# Patient Record
Sex: Female | Born: 1955 | Race: White | Hispanic: No | Marital: Married | Smoking: Former smoker
Health system: Southern US, Community
[De-identification: ages and names within clinical notes are randomized; demographics above are authoritative.]

## PROBLEM LIST (undated history)

## (undated) DIAGNOSIS — H3530 Unspecified macular degeneration: Secondary | ICD-10-CM

## (undated) DIAGNOSIS — I10 Essential (primary) hypertension: Secondary | ICD-10-CM

## (undated) HISTORY — DX: Essential (primary) hypertension: I10

## (undated) HISTORY — DX: Unspecified macular degeneration: H35.30

---

## 2020-11-01 ENCOUNTER — Encounter: Payer: Self-pay | Admitting: Medical

## 2020-11-01 ENCOUNTER — Ambulatory Visit (HOSPITAL_BASED_OUTPATIENT_CLINIC_OR_DEPARTMENT_OTHER)
Admission: RE | Admit: 2020-11-01 | Discharge: 2020-11-01 | Disposition: A | Discharge status: Home / Self Care | Payer: 59 | Source: Ambulatory Visit | Attending: Medical | Admitting: Medical

## 2020-11-01 ENCOUNTER — Telehealth: Payer: Self-pay | Admitting: Medical

## 2020-11-01 ENCOUNTER — Ambulatory Visit (INDEPENDENT_AMBULATORY_CARE_PROVIDER_SITE_OTHER): Payer: 59 | Admitting: Medical

## 2020-11-01 ENCOUNTER — Other Ambulatory Visit: Payer: Self-pay

## 2020-11-01 VITALS — BP 202/90 | HR 91 | Resp 20 | Ht 64.0 in | Wt 229.4 lb

## 2020-11-01 DIAGNOSIS — I10 Essential (primary) hypertension: Secondary | ICD-10-CM

## 2020-11-01 DIAGNOSIS — R0600 Dyspnea, unspecified: Secondary | ICD-10-CM | POA: Diagnosis present

## 2020-11-01 DIAGNOSIS — Z1231 Encounter for screening mammogram for malignant neoplasm of breast: Secondary | ICD-10-CM

## 2020-11-01 DIAGNOSIS — M8580 Other specified disorders of bone density and structure, unspecified site: Secondary | ICD-10-CM | POA: Diagnosis not present

## 2020-11-01 DIAGNOSIS — D869 Sarcoidosis, unspecified: Secondary | ICD-10-CM | POA: Insufficient documentation

## 2020-11-01 DIAGNOSIS — M25561 Pain in right knee: Secondary | ICD-10-CM | POA: Diagnosis not present

## 2020-11-01 DIAGNOSIS — Z124 Encounter for screening for malignant neoplasm of cervix: Secondary | ICD-10-CM

## 2020-11-01 MED ORDER — ALENDRONATE SODIUM 70 MG PO TABS: 70.0000 mg | ORAL_TABLET | ORAL | 11 refills | Status: DC

## 2020-11-01 MED ORDER — LABETALOL HCL 300 MG PO TABS: 300.0000 mg | ORAL_TABLET | Freq: Two times a day (BID) | ORAL | 11 refills | Status: DC

## 2020-11-01 NOTE — Telephone Encounter (Signed)
Referral to pulmonologist placed. 

## 2020-11-01 NOTE — Progress Notes (Signed)
Subjective:    Patient ID: Bianca Stephenson, female    DOB: [birth year], 64 y.o.   MRN: [id number]  HPI  Pt in for first time.  Pt moved from [place]. Pt tries to exercise but knee pain holding her back. Moderate healthy diet. No alcohol use. Non smoker. But hx. Of smoking.  Rt knee pain for about 3 weeks. Anterior aspect pain. Stats was walking and may have twisted wrong way. Now medial aspect pain. Using ibuprofen for the pain. No prior history of knee pain.  Pt has hx of htn. She used to be on labetolol 300 mg twice a day. Pt was on it for about 5 years. Pt states her bp was about 136/90 when on. Pt had bp 179/102 at home. Pt has also some white coat component.   Over past 3 months many readings 150 systolic.  Also history of thin bones. She not sure if osteopenia or osteoporosis. Pt states  No copy of her recent bone density.  Pt stats up to date on colonoscopy. Had polyp about 4 years ago. Told repeat in 5 years.  Pt due for mammogram  Review of Systems  Constitutional: Negative for chills, fatigue and fever.  Respiratory: Negative for cough, chest tightness, shortness of breath and wheezing.   Cardiovascular: Negative for chest pain and palpitations.  Gastrointestinal: Negative for abdominal pain.  Musculoskeletal: Negative for back pain and joint swelling.  Skin: Negative for rash.  Neurological: Negative for dizziness, speech difficulty, weakness, numbness and headaches.       Only find light headed sensation.  Pt states nervous/more than usual since new provider visit per pt.  Hematological: Negative for adenopathy. Does not bruise/bleed easily.  Psychiatric/Behavioral: Negative for behavioral problems and confusion. The patient is not nervous/anxious.     No past medical history on file.   Social History   Socioeconomic History  . Marital status: Married    Spouse name: Not on file  . Number of children: Not on file  . Years of education: Not on file  . Highest  education level: Not on file  Occupational History  . Not on file  Tobacco Use  . Smoking status: Not on file  . Smokeless tobacco: Not on file  Substance and Sexual Activity  . Alcohol use: Not on file  . Drug use: Not on file  . Sexual activity: Not on file  Other Topics Concern  . Not on file  Social History Narrative  . Not on file   Social Determinants of Health   Financial Resource Strain: Not on file  Food Insecurity: Not on file  Transportation Needs: Not on file  Physical Activity: Not on file  Stress: Not on file  Social Connections: Not on file  Intimate Partner Violence: Not on file     No family history on file.  Not on File  No current outpatient medications on file prior to visit.   No current facility-administered medications on file prior to visit.    BP (!) 202/90   Pulse 91   Resp 20   Ht 5\' 4"  (1.626 m)   Wt 229 lb 6.4 oz (104.1 kg)   SpO2 95%   BMI 39.38 kg/m       Objective:   Physical Exam  General Mental Status- Alert. General Appearance- Not in acute distress.   Skin General: Color- Normal Color. Moisture- Normal Moisture.  Neck Carotid Arteries- Normal color. Moisture- Normal Moisture. No carotid bruits. No JVD.  Chest  and Lung Exam Auscultation: Breath Sounds:-Normal.  Cardiovascular Auscultation:Rythm- Regular. Murmurs & Other Heart Sounds:Auscultation of the heart reveals- No Murmurs.  Abdomen Inspection:-Inspeection Normal. Palpation/Percussion:Note:No mass. Palpation and Percussion of the abdomen reveal- Non Tender, Non Distended + BS, no rebound or guarding.   Neurologic Cranial Nerve exam:- CN III-XII intact(No nystagmus), symmetric smile. Normal/IntactStrength:- 5/5 equal and symmetric strength both upper and lower extremities.  Rt knee- no swelling, no warmth or tenderness on palpation. Medial tibial plateau area tenderness when fully extends.      Assessment & Plan:  Your blood pressure is very high  today but you have been off of labetalol for 3 months.  Normal neurologic exam with no obvious gross motor or sensory function deficits.  You report mild lightheaded sensation but nervous about being here today.  With such a high blood pressure if you have any gross motor or sensory function deficits then have to advise ED evaluation.  Expect now that you restart a medication that your blood pressure will drop to your former controlled range.  Check blood pressure daily and verify coming down.  History of osteopenia or osteoporosis.  Would asked that you sign release of medical information form so we can review your prior DEXA scan.  Refilled her Fosamax prescription today.  History of recent knee pain.  Can use Tylenol presently for pain but avoid any NSAIDs as that can increase her blood pressure.  X-ray right knee today and went ahead and placed referral to sports medicine.  History of sarcoidosis and prescribed some mild dyspnea on exertion for years.  We will go ahead and get chest x-ray.  Screening mammogram order placed.  Can discuss with radiology department today.  Placed referral for screening Pap smear/referral to gynecologist.  Follow-up in 1 week for blood pressure check or sooner as needed.

## 2020-11-01 NOTE — Patient Instructions (Addendum)
Your blood pressure is very high today but you have been off of labetalol for 3 months.  Normal neurologic exam with no obvious gross motor or sensory function deficits.  You report mild lightheaded sensation but nervous about being here today.  With such a high blood pressure if you have any gross motor or sensory function deficits then have to advise ED evaluation.  Expect now that you restart a medication that your blood pressure will drop to your former controlled range.  Check blood pressure daily and verify coming down.  Please get CMP lab today.  History of osteopenia or osteoporosis.  Would asked that you sign release of medical information form so we can review your prior DEXA scan.  Refilled her Fosamax prescription today.  History of recent knee pain.  Can use Tylenol presently for pain but avoid any NSAIDs as that can increase her blood pressure.  X-ray right knee today and went ahead and placed referral to sports medicine.  History of sarcoidosis and prescribed some mild dyspnea on exertion for years.  We will go ahead and get chest x-ray.  Screening mammogram order placed.  Can discuss with radiology department today.  Placed referral for screening Pap smear/referral to gynecologist.  Follow-up in 1 week for blood pressure check or sooner as needed.

## 2020-11-02 LAB — COMPREHENSIVE METABOLIC PANEL
AG Ratio: 1.3 (calc) (ref 1.0–2.5)
ALT: 11 U/L (ref 6–29)
AST: 17 U/L (ref 10–35)
Albumin: 4.4 g/dL (ref 3.6–5.1)
Alkaline phosphatase (APISO): 58 U/L (ref 37–153)
BUN: 13 mg/dL (ref 7–25)
CO2: 25 mmol/L (ref 20–32)
Calcium: 9.8 mg/dL (ref 8.6–10.4)
Chloride: 102 mmol/L (ref 98–110)
Creat: 0.99 mg/dL (ref 0.50–0.99)
Globulin: 3.3 g/dL (calc) (ref 1.9–3.7)
Glucose, Bld: 78 mg/dL (ref 65–99)
Potassium: 4.2 mmol/L (ref 3.5–5.3)
Sodium: 139 mmol/L (ref 135–146)
Total Bilirubin: 0.4 mg/dL (ref 0.2–1.2)
Total Protein: 7.7 g/dL (ref 6.1–8.1)

## 2020-11-08 ENCOUNTER — Ambulatory Visit (INDEPENDENT_AMBULATORY_CARE_PROVIDER_SITE_OTHER): Payer: 59 | Admitting: Medical

## 2020-11-08 ENCOUNTER — Other Ambulatory Visit: Payer: Self-pay

## 2020-11-08 DIAGNOSIS — M25569 Pain in unspecified knee: Secondary | ICD-10-CM

## 2020-11-08 MED ORDER — MELOXICAM 7.5 MG PO TABS: 7.5000 mg | ORAL_TABLET | Freq: Every day | ORAL | 0 refills | Status: DC

## 2020-11-08 NOTE — Progress Notes (Addendum)
Pt here for Blood pressure check per Esperanza Richters PA-C  Pt currently takes:labetalol 300 mg   Pt reports compliance with medication.  BP today @ =128/78 HR =63  Pt advised per Esperanza Richters PA-C pt will continue regimen and send in Meloxicam 7.5mg  for pain knee.  Esperanza Richters, PA-C

## 2020-11-12 ENCOUNTER — Ambulatory Visit: Payer: Self-pay

## 2020-11-12 ENCOUNTER — Other Ambulatory Visit: Payer: Self-pay

## 2020-11-12 ENCOUNTER — Ambulatory Visit (HOSPITAL_BASED_OUTPATIENT_CLINIC_OR_DEPARTMENT_OTHER)
Admission: RE | Admit: 2020-11-12 | Discharge: 2020-11-12 | Disposition: A | Discharge status: Home / Self Care | Payer: 59 | Source: Ambulatory Visit | Attending: Medical | Admitting: Medical

## 2020-11-12 ENCOUNTER — Ambulatory Visit (INDEPENDENT_AMBULATORY_CARE_PROVIDER_SITE_OTHER): Payer: 59 | Admitting: Family Medicine

## 2020-11-12 ENCOUNTER — Encounter: Payer: Self-pay | Admitting: Family Medicine

## 2020-11-12 ENCOUNTER — Encounter (HOSPITAL_BASED_OUTPATIENT_CLINIC_OR_DEPARTMENT_OTHER): Payer: Self-pay

## 2020-11-12 VITALS — BP 192/108 | Ht 64.0 in | Wt 229.0 lb

## 2020-11-12 DIAGNOSIS — M25561 Pain in right knee: Secondary | ICD-10-CM

## 2020-11-12 DIAGNOSIS — M23203 Derangement of unspecified medial meniscus due to old tear or injury, right knee: Secondary | ICD-10-CM | POA: Diagnosis not present

## 2020-11-12 DIAGNOSIS — Z1231 Encounter for screening mammogram for malignant neoplasm of breast: Secondary | ICD-10-CM | POA: Diagnosis present

## 2020-11-12 MED ORDER — DICLOFENAC SODIUM 1 % EX GEL: 4.0000 g | Freq: Four times a day (QID) | CUTANEOUS | 11 refills | Status: DC

## 2020-11-12 NOTE — Progress Notes (Signed)
  Ellice Boultinghouse - 65 y.o. female MRN [id number]  Date of birth: [birth year]  SUBJECTIVE:  Including CC & ROS.  No chief complaint on file.   Joshlyn Beadle is a 65 y.o. female that is presenting with right knee pain.  Pain has been ongoing for about 1 month.  She has pain with walking but also has pain at night.  No injury or inciting event.  Has been taking meloxicam..  Independent review of the right knee x-ray from 4/29 shows no acute changes.   Review of Systems See HPI   HISTORY: Past Medical, Surgical, Social, and Family History Reviewed & Updated per EMR.   Pertinent Historical Findings include:  Past Medical History:  Diagnosis Date  . Hypertension     History reviewed. No pertinent surgical history.  History reviewed. No pertinent family history.  Social History   Socioeconomic History  . Marital status: Married    Spouse name: Not on file  . Number of children: Not on file  . Years of education: Not on file  . Highest education level: Not on file  Occupational History  . Occupation: retired  Tobacco Use  . Smoking status: Former Smoker    Packs/day: 1.00    Years: 10.00    Pack years: 10.00    Types: Cigarettes    Quit date: 11/01/1996    Years since quitting: 24.0  . Smokeless tobacco: Never Used  Substance and Sexual Activity  . Alcohol use: Never  . Drug use: Never  . Sexual activity: Yes  Other Topics Concern  . Not on file  Social History Narrative  . Not on file   Social Determinants of Health   Financial Resource Strain: Not on file  Food Insecurity: Not on file  Transportation Needs: Not on file  Physical Activity: Not on file  Stress: Not on file  Social Connections: Not on file  Intimate Partner Violence: Not on file     PHYSICAL EXAM:  VS: BP (!) 192/108 (BP Location: Left Arm, Patient Position: Sitting, Cuff Size: Large)   Ht 5\' 4"  (1.626 m)   Wt 229 lb (103.9 kg)   BMI 39.31 kg/m  Physical Exam Gen: NAD, alert, cooperative with  exam, well-appearing MSK:  Right knee: Normal range of motion. Normal strength resistance. Neurovascular intact  Limited ultrasound: Right knee:  Moderate effusion. Normal-appearing quadricep and patellar tendon. Moderate medial joint space with outpouching of the medial meniscus Lateral meniscus with moderate degenerative changes.  Summary: Degenerative changes of the medial meniscus  Ultrasound and interpretation by , MD    ASSESSMENT & PLAN:   Degenerative tear of medial meniscus, right Moderate effusion and degenerative changes of the medial meniscus.  Has weakness with hip abduction that is contributing to these symptoms.  Tends to pronate as well. -Counseled on home exercise therapy and supportive care. -Voltaren. -Referral to physical therapy. -Could consider injection

## 2020-11-12 NOTE — Patient Instructions (Addendum)
Nice to meet you Please try physical therapy.  Please try the exercises  Please try the rub on medicine once the pain isn't as bad  Please send me a message in MyChart with any questions or updates.  Please see me back in 4 weeks.   --Dr. Jordan Likes

## 2020-11-12 NOTE — Assessment & Plan Note (Signed)
Moderate effusion and degenerative changes of the medial meniscus.  Has weakness with hip abduction that is contributing to these symptoms.  Tends to pronate as well. -Counseled on home exercise therapy and supportive care. -Voltaren. -Referral to physical therapy. -Could consider injection

## 2020-11-27 ENCOUNTER — Encounter: Payer: Self-pay | Admitting: Physical Therapy

## 2020-11-27 ENCOUNTER — Other Ambulatory Visit: Payer: Self-pay

## 2020-11-27 ENCOUNTER — Ambulatory Visit: Payer: 59 | Attending: Family Medicine | Admitting: Physical Therapy

## 2020-11-27 DIAGNOSIS — M25561 Pain in right knee: Secondary | ICD-10-CM | POA: Insufficient documentation

## 2020-11-27 DIAGNOSIS — R262 Difficulty in walking, not elsewhere classified: Secondary | ICD-10-CM | POA: Diagnosis present

## 2020-11-27 NOTE — Therapy (Signed)
[facility] [address]  [address] [place], [place], [address] Phone: [phone]   Fax:  [phone]  Physical Therapy Evaluation  Patient Details  Name: Bianca Stephenson MRN: [id number] Date of Birth: 01-Jul-1956 Referring Provider (PT): Clare Gandy, MD   Encounter Date: 11/27/2020   PT End of Session - 11/27/20 1144    Visit Number 1    Number of Visits 1    Date for PT Re-Evaluation 11/27/20    Authorization Type [employer]    PT Start Time 1057    PT Stop Time 1139    PT Time Calculation (min) 42 min    Activity Tolerance Patient tolerated treatment well    Behavior During Therapy [facility] for tasks assessed/performed           Past Medical History:  Diagnosis Date  . Hypertension     History reviewed. No pertinent surgical history.  There were no vitals filed for this visit.    Subjective Assessment - 11/27/20 1059    Subjective Patient reports R knee pain since 10/13/20 when she was walking and felt like "something pulled" in her R knee. Pain is located over the R medial aspect. Worse with prolonged walking, bending, certain positions in bed. Notes that initially she could not even walk around the grocery store but it has improved. Denies use of AD. Better with ice and moving. Denies N/T. Would like to get set up on a HEP today.    Pertinent History HTN    Limitations Lifting;Standing;Walking;House hold activities;Sitting    How long can you sit comfortably? unlimited    How long can you stand comfortably? 20 min    How long can you walk comfortably? 20 min    Diagnostic tests 10/22/20 R knee xray: No osseous fracture or dislocation.  2. Probable joint effusion.    Patient Stated Goals get set up with some exercises    Currently in Pain? No/denies    Pain Score 0-No pain    Pain Location Knee    Pain Orientation Right;Medial    Pain Descriptors / Indicators Sharp;Dull    Pain Type Acute pain               [facility] PT Assessment - 11/27/20 1104      Assessment   Medical Diagnosis Degenerative tear of R medial meniscus    Referring Provider (PT) Clare Gandy, MD    Onset Date/Surgical Date 10/13/20    Next MD Visit 12/19/20    Prior Therapy no      Precautions   Precautions None      Balance Screen   Has the patient fallen in the past 6 months Yes    How many times? 1   fell flat on face on uneven side walk   Has the patient had a decrease in activity level because of a fear of falling?  Yes    Is the patient reluctant to leave their home because of a fear of falling?  No      Home Tourist information centre manager residence    Living Arrangements Spouse/significant other    Available Help at Discharge Family    Type of Home House    Home Access Stairs to enter    Entrance Stairs-Number of Steps 1    Entrance Stairs-Rails None    Home Layout One level    Home Equipment None      Prior Function  Level of Independence Independent    Vocation Retired    Leisure Nurse, children's Status Within Functional Limits for tasks assessed      Sensation   Light Touch Appears Intact      Posture/Postural Control   Posture/Postural Control Postural limitations    Postural Limitations Rounded Shoulders      ROM / Strength   AROM / PROM / Strength AROM;PROM;Strength      AROM   AROM Assessment Site Knee    Right/Left Knee Right;Left    Right Knee Extension 0    Right Knee Flexion 121    Left Knee Extension -3    Left Knee Flexion 124      PROM   PROM Assessment Site Knee    Right/Left Knee Right;Left    Right Knee Extension 0    Right Knee Flexion 124    Left Knee Extension -3    Left Knee Flexion 127      Strength   Strength Assessment Site Hip;Knee;Ankle    Right/Left Hip Right;Left    Right Hip Flexion 4+/5    Right Hip ABduction 4+/5    Right Hip ADduction 4+/5    Left Hip Flexion 4+/5    Left Hip ABduction 4+/5    Left  Hip ADduction 4+/5    Right/Left Knee Right;Left    Right Knee Flexion 4+/5    Right Knee Extension 4+/5    Left Knee Flexion 4+/5    Left Knee Extension 5/5    Right/Left Ankle Right;Left    Right Ankle Dorsiflexion 4+/5    Right Ankle Plantar Flexion 4+/5    Left Ankle Dorsiflexion 4+/5    Left Ankle Plantar Flexion 4+/5      Flexibility   Soft Tissue Assessment /Muscle Length yes    Quadriceps B mildly tight in mod thomas    ITB B negative Ober's      Palpation   Patella mobility mild crepitus with inferior glide of L patella; WFL and pain-free on R    Palpation comment no TTP in R knee or calf; soft tissue restriction in R distal HS and proximal gastroc; mild edema over the R medial aspect of knee      Ambulation/Gait   Assistive device None    Gait Pattern Step-through pattern;Lateral trunk lean to right;Decreased stance time - right;Decreased step length - left;Antalgic    Ambulation Surface Level;Indoor    Gait velocity slightly decreased                      Objective measurements completed on examination: See above findings.               PT Education - 11/27/20 1144    Education Details prognosis, HEP    Person(s) Educated Patient    Methods Explanation;Demonstration;Tactile cues;Verbal cues;Handout    Comprehension Verbalized understanding;Returned demonstration            PT Short Term Goals - 11/27/20 1148      PT SHORT TERM GOAL #1   Title Patient to report understanding of HEP.    Status Achieved    Target Date 11/27/20             PT Long Term Goals - 11/27/20 1149      PT LONG TERM GOAL #1   Title Patient to report understanding of HEP.    Status Achieved    Target  Date 11/27/20                  Plan - 11/27/20 1144    Clinical Impression Statement Patient is a 65 y/o F presenting to OPPT with c/o R knee pain since injuring it while walking 6 weeks ago.  Pain is located over the R medial aspect of the knee  and worse with prolonged walking, knee flexion, and certain positions in bed. Denies N/T. Pain has improved since initial onset. Patient today presenting with asymmetrical but WFL R knee ROM, good LE strength, B tight hip flexors/quads, soft tissue restriction in R distal HS and proximal gastroc, mild edema over the R medial aspect of knee, and gait deviations. Patient was educated on gentle stretching and strengthening HEP- patient reported understanding. Patient requesting 1 time visit today d/t finances.    Personal Factors and Comorbidities Age;Comorbidity 1;Fitness;Past/Current Experience;Time since onset of injury/illness/exacerbation    Comorbidities HTN    Examination-Activity Limitations Sleep;Bed Mobility;Bend;Squat;Stairs;Stand;Carry;Transfers;Lift;Locomotion Level    Examination-Participation Restrictions Church;Cleaning;Community Activity;Laundry;Meal Prep;Yard Work;Shop    Stability/Clinical Decision Making Stable/Uncomplicated    Clinical Decision Making Low    Rehab Potential Good    PT Frequency One time visit    PT Treatment/Interventions ADLs/Self Care Home Management;Cryotherapy;Electrical Stimulation;Iontophoresis 4mg /ml Dexamethasone;Moist Heat;Balance training;Therapeutic exercise;Therapeutic activities;Functional mobility training;Stair training;Gait training;Ultrasound;Neuromuscular re-education;Patient/family education;Manual techniques;Vasopneumatic Device;Taping;Energy conservation;Dry needling;Passive range of motion    PT Next Visit Plan 1 time visit    Consulted and Agree with Plan of Care Patient           Patient will benefit from skilled therapeutic intervention in order to improve the following deficits and impairments:  Abnormal gait,Decreased endurance,Increased edema,Pain,Increased fascial restricitons,Decreased activity tolerance,Decreased balance,Difficulty walking,Improper body mechanics,Postural dysfunction,Impaired flexibility  Visit Diagnosis: Acute  pain of right knee  Difficulty in walking, not elsewhere classified     Problem List Patient Active Problem List   Diagnosis Date Noted  . Degenerative tear of medial meniscus, right 11/12/2020     01/12/2021, PT, DPT 11/27/20 11:50 AM   [facility] [address]  [address] [place], [place], [place] Phone: [phone]   Fax:  [phone]  Name: Bianca Stephenson MRN: Baker Pierini Date of Birth: 12/30/1955

## 2020-12-19 ENCOUNTER — Other Ambulatory Visit: Payer: Self-pay

## 2020-12-19 ENCOUNTER — Ambulatory Visit (INDEPENDENT_AMBULATORY_CARE_PROVIDER_SITE_OTHER): Payer: 59 | Admitting: Family Medicine

## 2020-12-19 ENCOUNTER — Encounter: Payer: Self-pay | Admitting: Family Medicine

## 2020-12-19 DIAGNOSIS — M23203 Derangement of unspecified medial meniscus due to old tear or injury, right knee: Secondary | ICD-10-CM | POA: Diagnosis not present

## 2020-12-19 NOTE — Assessment & Plan Note (Signed)
Has done well with the home exercises and does not endorse any significant pain.  He has completed some physical therapy. -Counseled on home exercise therapy and supportive care. -Could consider injection.

## 2020-12-19 NOTE — Progress Notes (Signed)
  Bianca Stephenson - 65 y.o. female MRN [id number]  Date of birth: 1955-11-10  SUBJECTIVE:  Including CC & ROS.  No chief complaint on file.   Bianca Stephenson is a 65 y.o. female that is following up for her knee pain.  Her knee pain has been doing quite well.  She only notices mild pain and sometimes at night and does not notice it in the morning.  She is able to walk and do her normal activities..     Review of Systems See HPI   HISTORY: Past Medical, Surgical, Social, and Family History Reviewed & Updated per EMR.   Pertinent Historical Findings include:  Past Medical History:  Diagnosis Date   Hypertension     History reviewed. No pertinent surgical history.  History reviewed. No pertinent family history.  Social History   Socioeconomic History   Marital status: Married    Spouse name: Not on file   Number of children: Not on file   Years of education: Not on file   Highest education level: Not on file  Occupational History   Occupation: retired  Tobacco Use   Smoking status: Former    Packs/day: 1.00    Years: 10.00    Pack years: 10.00    Types: Cigarettes    Quit date: 11/01/1996    Years since quitting: 24.1   Smokeless tobacco: Never  Substance and Sexual Activity   Alcohol use: Never   Drug use: Never   Sexual activity: Yes  Other Topics Concern   Not on file  Social History Narrative   Not on file   Social Determinants of Health   Financial Resource Strain: Not on file  Food Insecurity: Not on file  Transportation Needs: Not on file  Physical Activity: Not on file  Stress: Not on file  Social Connections: Not on file  Intimate Partner Violence: Not on file     PHYSICAL EXAM:  VS: BP (!) 200/108 (BP Location: Left Arm, Patient Position: Sitting, Cuff Size: Large)   Ht 5\' 4"  (1.626 m)   Wt 229 lb (103.9 kg)   BMI 39.31 kg/m  Physical Exam Gen: NAD, alert, cooperative with exam, well-appearing     ASSESSMENT & PLAN:   Degenerative tear of  medial meniscus, right Has done well with the home exercises and does not endorse any significant pain.  He has completed some physical therapy. -Counseled on home exercise therapy and supportive care. -Could consider injection.

## 2021-01-01 ENCOUNTER — Encounter: Payer: 59 | Admitting: Obstetrics & Gynecology

## 2021-01-10 ENCOUNTER — Ambulatory Visit (INDEPENDENT_AMBULATORY_CARE_PROVIDER_SITE_OTHER): Payer: 59 | Admitting: Medical

## 2021-01-10 ENCOUNTER — Other Ambulatory Visit: Payer: Self-pay

## 2021-01-10 VITALS — BP 170/89 | HR 65 | Temp 98.2°F | Resp 18 | Ht 64.0 in | Wt 230.2 lb

## 2021-01-10 DIAGNOSIS — I10 Essential (primary) hypertension: Secondary | ICD-10-CM | POA: Diagnosis not present

## 2021-01-10 MED ORDER — CHLORTHALIDONE 25 MG PO TABS: 25.0000 mg | ORAL_TABLET | Freq: Every day | ORAL | 3 refills | Status: DC

## 2021-01-10 NOTE — Progress Notes (Signed)
Subjective:    Patient ID: Bianca Stephenson, female    DOB: 06/18/1956, 65 y.o.   MRN: [id number]  HPI  Pt in for follow up for htn.  At home bp 137/80 at 1 pm. Pt states when she had tooth pain when crown brok off and when she was on vacation/gaining wt her bp was elevated at around 160/90.  Over last week when she was eating better her 140-130/80-90.  Hx of white coat htn as well.   Patient notes that prior to labetalol she had been on various blood pressure medications that did not work.  I reviewed records that we have from former primary care and do not see a list of BP meds she was on.  Patient mentions that her cardiologist had tried various meds.  Unfortunately she cannot remember the name of that cardiologist office.   Pt former knee pain is resolved.    Review of Systems  Constitutional:  Negative for chills, fatigue and fever.  Respiratory:  Negative for cough, chest tightness, shortness of breath and wheezing.   Cardiovascular:  Negative for chest pain and palpitations.  Gastrointestinal:  Negative for abdominal pain.  Neurological:  Negative for dizziness, syncope, weakness, light-headedness, numbness and headaches.  Hematological:  Negative for adenopathy. Does not bruise/bleed easily.  Psychiatric/Behavioral:  Negative for behavioral problems, decreased concentration, dysphoric mood, sleep disturbance and suicidal ideas.     Past Medical History:  Diagnosis Date   Hypertension      Social History   Socioeconomic History   Marital status: Married    Spouse name: Not on file   Number of children: Not on file   Years of education: Not on file   Highest education level: Not on file  Occupational History   Occupation: retired  Tobacco Use   Smoking status: Former    Packs/day: 1.00    Years: 10.00    Pack years: 10.00    Types: Cigarettes    Quit date: 11/01/1996    Years since quitting: 24.2   Smokeless tobacco: Never  Substance and Sexual Activity    Alcohol use: Never   Drug use: Never   Sexual activity: Yes  Other Topics Concern   Not on file  Social History Narrative   Not on file   Social Determinants of Health   Financial Resource Strain: Not on file  Food Insecurity: Not on file  Transportation Needs: Not on file  Physical Activity: Not on file  Stress: Not on file  Social Connections: Not on file  Intimate Partner Violence: Not on file    No past surgical history on file.  No family history on file.  Allergies  Allergen Reactions   Penicillins Other (See Comments)    Current Outpatient Medications on File Prior to Visit  Medication Sig Dispense Refill   alendronate (FOSAMAX) 70 MG tablet Take 1 tablet (70 mg total) by mouth every 7 (seven) days. Take with a full glass of water on an empty stomach. 4 tablet 11   diclofenac Sodium (VOLTAREN) 1 % GEL Apply 4 g topically 4 (four) times daily. To affected joint. 100 g 11   labetalol (NORMODYNE) 300 MG tablet Take 1 tablet (300 mg total) by mouth 2 (two) times daily. 60 tablet 11   No current facility-administered medications on file prior to visit.    BP (!) 170/89   Pulse 65   Temp 98.2 F (36.8 C)   Resp 18   Ht 5\' 4"  (1.626 m)  Wt 230 lb 3.2 oz (104.4 kg)   SpO2 97%   BMI 39.51 kg/m       Objective:   Physical Exam   General Mental Status- Alert. General Appearance- Not in acute distress.   Skin General: Color- Normal Color. Moisture- Normal Moisture.  Neck Carotid Arteries- Normal color. Moisture- Normal Moisture. No carotid bruits. No JVD.  Chest and Lung Exam Auscultation: Breath Sounds:-Normal.  Cardiovascular Auscultation:Rythm- Regular. Murmurs & Other Heart Sounds:Auscultation of the heart reveals- No Murmurs.  Abdomen Inspection:-Inspeection Normal. Palpation/Percussion:Note:No mass. Palpation and Percussion of the abdomen reveal- Non Tender, Non Distended + BS, no rebound or guarding.    Neurologic Cranial Nerve exam:- CN  III-XII intact(No nystagmus), symmetric smile. Strength:- 5/5 equal and symmetric strength both upper and lower extremities.      Assessment & Plan:   History of hypertension with recent  high levels when crown cracked and while she was on vacation.  Now at tooth pain has resolved and she is eating better her blood pressure has decreased over the last week.  At home blood pressure reading today was 137/80.  However high reading when we checked.  She does have history of whitecoat hypertension as well.  Recommend that you continue labetalol, low-salt diet and continue to try to lose some weight.  Check blood pressure later today when you are relaxed at home.  If BP less than 140/90 continue labetalol.  If greater than 140/90 then go ahead and add chlorthalidone 25 mg daily.  Get CMP today.  Glad to hear for knee pain resolved.  Follow-up in 1 month or as needed.

## 2021-01-10 NOTE — Patient Instructions (Addendum)
History of hypertension with recent  high levels when crown cracked and while she was on vacation.  Now at tooth pain has resolved and she is eating better her blood pressure has decreased over the last week.  At home blood pressure reading today was 137/80.  However high reading when we checked.  She does have history of whitecoat hypertension as well.  Recommend that you continue labetalol, low-salt diet and continue to try to lose some weight.  Check blood pressure later today when you are relaxed at home.  If BP less than 140/90 continue labetalol.  If greater than 140/90 then go ahead and add chlorthalidone 25 mg daily.  Get CMP today.  Note if you do use the chlorthalidone make sure that you are eating potassium rich diet.  Glad to hear for knee pain resolved.  Follow-up in 1 month or as needed.

## 2021-01-10 NOTE — Addendum Note (Signed)
Addended by: Rosita Kea on: 01/10/2021 02:19 PM   Modules accepted: Orders

## 2021-01-11 LAB — COMPREHENSIVE METABOLIC PANEL
AG Ratio: 1.6 (calc) (ref 1.0–2.5)
ALT: 12 U/L (ref 6–29)
AST: 17 U/L (ref 10–35)
Albumin: 4.3 g/dL (ref 3.6–5.1)
Alkaline phosphatase (APISO): 56 U/L (ref 37–153)
BUN/Creatinine Ratio: 17 (calc) (ref 6–22)
BUN: 17 mg/dL (ref 7–25)
CO2: 27 mmol/L (ref 20–32)
Calcium: 9.4 mg/dL (ref 8.6–10.4)
Chloride: 105 mmol/L (ref 98–110)
Creat: 1.01 mg/dL — ABNORMAL HIGH (ref 0.50–0.99)
Globulin: 2.7 g/dL (calc) (ref 1.9–3.7)
Glucose, Bld: 95 mg/dL (ref 65–99)
Potassium: 4.5 mmol/L (ref 3.5–5.3)
Sodium: 140 mmol/L (ref 135–146)
Total Bilirubin: 0.4 mg/dL (ref 0.2–1.2)
Total Protein: 7 g/dL (ref 6.1–8.1)

## 2021-01-21 ENCOUNTER — Other Ambulatory Visit: Payer: Self-pay

## 2021-01-21 ENCOUNTER — Ambulatory Visit (INDEPENDENT_AMBULATORY_CARE_PROVIDER_SITE_OTHER): Payer: 59 | Admitting: Pulmonary Disease

## 2021-01-21 ENCOUNTER — Encounter: Payer: Self-pay | Admitting: Pulmonary Disease

## 2021-01-21 VITALS — BP 150/90 | HR 55 | Ht 64.0 in | Wt 227.8 lb

## 2021-01-21 DIAGNOSIS — R0602 Shortness of breath: Secondary | ICD-10-CM | POA: Diagnosis not present

## 2021-01-21 DIAGNOSIS — R062 Wheezing: Secondary | ICD-10-CM

## 2021-01-21 DIAGNOSIS — D869 Sarcoidosis, unspecified: Secondary | ICD-10-CM

## 2021-01-21 LAB — CBC WITH DIFFERENTIAL/PLATELET
Basophils Absolute: 0 10*3/uL (ref 0.0–0.1)
Basophils Relative: 0.3 % (ref 0.0–3.0)
Eosinophils Absolute: 0.1 10*3/uL (ref 0.0–0.7)
Eosinophils Relative: 1.2 % (ref 0.0–5.0)
HCT: 36.3 % (ref 36.0–46.0)
Hemoglobin: 12.4 g/dL (ref 12.0–15.0)
Lymphocytes Relative: 15.7 % (ref 12.0–46.0)
Lymphs Abs: 1.3 10*3/uL (ref 0.7–4.0)
MCHC: 34.2 g/dL (ref 30.0–36.0)
MCV: 87.4 fl (ref 78.0–100.0)
Monocytes Absolute: 0.8 10*3/uL (ref 0.1–1.0)
Monocytes Relative: 10.1 % (ref 3.0–12.0)
Neutro Abs: 6 10*3/uL (ref 1.4–7.7)
Neutrophils Relative %: 72.7 % (ref 43.0–77.0)
Platelets: 243 10*3/uL (ref 150.0–400.0)
RBC: 4.16 Mil/uL (ref 3.87–5.11)
RDW: 14.2 % (ref 11.5–15.5)
WBC: 8.3 10*3/uL (ref 4.0–10.5)

## 2021-01-21 NOTE — Progress Notes (Signed)
Subjective:   PATIENT ID: Bianca Stephenson GENDER: female DOB: April 05, 1956, MRN: [id number]   HPI  Chief Complaint  Patient presents with   Consult    Sarcoidosis and dyspnea. Shortness of breath going up and downstairs    Reason for Visit: New consult for sarcoidosis  [name] is a 65 year old with sarcoidosis and hypertension who presents to establish Pulmonary care.  She was diagnosed skin biopsy 30 years ago. She was referred to Pulmonology in 1998 and had PFTs and underwent bronchoscopy five times. She was on prednisone and bronchodilators for period of time. She reports her symptoms at the time were shortness of breath.  She reports rare wheezing once a month associated with exertion. She denies significant shortness of breath but will have it heavy exertion or activity. Able to walk up a flight a stairs. Denies chest pain, cough.  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  Past Medical History:  Diagnosis Date   Hypertension      No family history on file. No history of sarcoid in family   Social History   Occupational History   Occupation: retired  Tobacco Use   Smoking status: Former    Packs/day: 1.00    Years: 10.00    Pack years: 10.00    Types: Cigarettes    Start date: 1979    Quit date: 11/01/1996    Years since quitting: 24.2   Smokeless tobacco: Never  Substance and Sexual Activity   Alcohol use: Never   Drug use: Never   Sexual activity: Yes    Allergies  Allergen Reactions   Penicillins Other (See Comments)     Outpatient Medications Prior to Visit  Medication Sig Dispense Refill   alendronate (FOSAMAX) 70 MG tablet Take 1 tablet (70 mg total) by mouth every 7 (seven) days. Take with a full glass of water on an empty stomach. 4 tablet 11   chlorthalidone (HYGROTON) 25 MG tablet Take 1 tablet (25 mg total) by mouth daily. 30 tablet 3    labetalol (NORMODYNE) 300 MG tablet Take 1 tablet (300 mg total) by mouth 2 (two) times daily. 60 tablet 11   diclofenac Sodium (VOLTAREN) 1 % GEL Apply 4 g topically 4 (four) times daily. To affected joint. (Patient not taking: Reported on 01/21/2021) 100 g 11   No facility-administered medications prior to visit.    Review of Systems  Constitutional:  Negative for chills, diaphoresis, fever, malaise/fatigue and weight loss.  HENT:  Negative for congestion, ear pain and sore throat.   Respiratory:  Positive for shortness of breath and wheezing. Negative for cough, hemoptysis and sputum production.   Cardiovascular:  Negative for chest pain, palpitations and leg swelling.  Gastrointestinal:  Negative for abdominal pain, heartburn and nausea.  Genitourinary:  Negative for frequency.  Musculoskeletal:  Negative for joint pain and myalgias.  Skin:  Negative for itching and rash.  Neurological:  Negative for dizziness, weakness and headaches.  Endo/Heme/Allergies:  Does not bruise/bleed easily.  Psychiatric/Behavioral:  Negative for depression. The patient is not nervous/anxious.     Objective:   Vitals:   01/21/21 1337  BP: (!) 150/90  Pulse: (!) 55  SpO2: 98%  Weight: 227 lb 12.8 oz (103.3 kg)  Height: 5\' 4"  (1.626 m)   SpO2: 98 %  Physical Exam: General: Well-appearing, no acute distress HENT: [place], AT Eyes: EOMI, no scleral icterus Respiratory: Clear to auscultation bilaterally.  No crackles, wheezing or rales Cardiovascular: RRR, -M/R/G, no JVD Extremities:-Edema,-tenderness Neuro: AAO x4, CNII-XII grossly intact Psych: Normal mood, normal affect  Data Reviewed:  Imaging: CXR 11/01/20 - Chronic interstitial lung changes  PFT: None on file  Labs: CBC    Component Value Date/Time   WBC 8.3 01/21/2021 1417   RBC 4.16 01/21/2021 1417   HGB 12.4 01/21/2021 1417   HCT 36.3 01/21/2021 1417   PLT 243.0 01/21/2021 1417   MCV 87.4 01/21/2021 1417   MCHC 34.2 01/21/2021  1417   RDW 14.2 01/21/2021 1417   LYMPHSABS 1.3 01/21/2021 1417   MONOABS 0.8 01/21/2021 1417   EOSABS 0.1 01/21/2021 1417   BASOSABS 0.0 01/21/2021 1417   CMP Latest Ref Rng & Units 01/10/2021 11/01/2020  Glucose 65 - 99 mg/dL 95 78  BUN 7 - 25 mg/dL 17 13  Creatinine 4.74 - 0.99 mg/dL 2.59(D) 6.38  Sodium 756 - 146 mmol/L 140 139  Potassium 3.5 - 5.3 mmol/L 4.5 4.2  Chloride 98 - 110 mmol/L 105 102  CO2 20 - 32 mmol/L 27 25  Calcium 8.6 - 10.4 mg/dL 9.4 9.8  Total Protein 6.1 - 8.1 g/dL 7.0 7.7  Total Bilirubin 0.2 - 1.2 mg/dL 0.4 0.4  AST 10 - 35 U/L 17 17  ALT 6 - 29 U/L 12 11   Quantiferon TB Gold Latest Ref Rng & Units 01/21/2021  Quantiferon TB Gold Plus NEGATIVE NEGATIVE   EKG 01/21/21 - Sinus bradycardia. Normal PR and Qtc interval.  Assessment & Plan:   Discussion: 65 year old female with history of sarcoidosis initially diagnosed in 1998 via skin biopsy. Reports shortness of breath with exertion and occasional wheezing. No PFTs on file. CXR reviewed which did demonstrate chronic interstitial changes with hilar adenopathy that could be related to her sarcoid. We discussed the clinical course of sarcoid and management including serial PFTs, labs, eye exam, and EKG and chest imaging if indicated. If symptoms suggest sarcoid flare in the future, we would manage with steroids +/- biologics.  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  Will schedule at next visit - REFER to ophthalmology  - EKG reviewed. No evidence of conduction abnormalities.  - ORDER QuantiFERON-TB baseline - ORDER CBC  Health Maintenance Immunization History  Administered Date(s) Administered   PFIZER(Purple Top)SARS-COV-2 Vaccination 09/21/2019, 10/13/2019, 06/05/2020   CT Lung Screen - not indicated  Orders Placed This Encounter  Procedures   QuantiFERON-TB Gold Plus    Standing Status:   Future    Number  of Occurrences:   1    Standing Expiration Date:   01/21/2022   CBC w/Diff    Standing Status:   Future    Number of Occurrences:   1    Standing Expiration Date:   01/21/2022   Ambulatory referral to Ophthalmology    Referral Priority:   Routine    Referral Type:   Consultation    Referral Reason:   Specialty Services Required    Requested Specialty:   Ophthalmology    Number of Visits Requested:   1   EKG 12-Lead   Pulmonary Function Test    Standing Status:   Future    Standing Expiration Date:   01/21/2022    Scheduling Instructions:     1 hour with follow up    Order Specific Question:   Where should this test be performed?  Answer:   [facility]    Order Specific Question:   Full PFT: includes the following: basic spirometry, spirometry pre & post bronchodilator, diffusion capacity (DLCO), lung volumes    Answer:   Full PFT  No orders of the defined types were placed in this encounter.   No follow-ups on file.  I have spent a total time of 45-minutes on the day of the appointment reviewing prior documentation, coordinating care and discussing medical diagnosis and plan with the patient/family. Imaging, labs and tests included in this note have been reviewed and interpreted independently by me.  Kayonna Lawniczak Mechele Collin, MD [facility] 01/21/2021 1:45 PM  Office Number [phone]

## 2021-01-21 NOTE — Patient Instructions (Addendum)
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  Will schedule at next visit - REFER to ophthalmology  - EKG reviewed. No evidence of conduction abnormalities.  - ORDER QuantiFERON-TB baseline - ORDER CBC  Follow-up with me after Pulmonary function tests

## 2021-01-24 LAB — QUANTIFERON-TB GOLD PLUS
Mitogen-NIL: >10 IU/mL
NIL: 0.02 IU/mL
QuantiFERON-TB Gold Plus: NEGATIVE
TB1-NIL: 0.07 IU/mL
TB2-NIL: 0.06 IU/mL

## 2021-02-10 ENCOUNTER — Ambulatory Visit (INDEPENDENT_AMBULATORY_CARE_PROVIDER_SITE_OTHER): Payer: 59 | Admitting: Medical

## 2021-02-10 ENCOUNTER — Other Ambulatory Visit: Payer: Self-pay

## 2021-02-10 ENCOUNTER — Encounter: Payer: Self-pay | Admitting: Medical

## 2021-02-10 VITALS — BP 138/85 | HR 62 | Resp 20 | Ht 64.0 in | Wt 228.0 lb

## 2021-02-10 DIAGNOSIS — I10 Essential (primary) hypertension: Secondary | ICD-10-CM

## 2021-02-10 NOTE — Progress Notes (Signed)
Subjective:    Patient ID: Bianca Stephenson, female    DOB: 1956/02/16, 65 y.o.   MRN: [id number]  HPI  Pt in for bp check. At home earlier today 113/76. All past week her bp 120/70-78.  Hx of white coat htn.   Last visit recommended below in ".  "Recommend that you continue labetalol, low-salt diet and continue to try to lose some weight.  Check blood pressure later today when you are relaxed at home.  If BP less than 140/90 continue labetalol.  If greater than 140/90 then go ahead and add chlorthalidone 25 mg daily."  Pt did start chorthalidone since day after I saw her diastolic was 147.  Labs a month ago were overall stable.  Pt has been eating potassium rich foods. Pt has no cramping of legs.  Pt heading back to [place] for one week visit with family.    Review of Systems  Constitutional:  Negative for chills, fatigue and fever.  Respiratory:  Negative for cough, chest tightness, shortness of breath and wheezing.   Cardiovascular:  Negative for chest pain and palpitations.  Gastrointestinal:  Negative for abdominal pain.  Genitourinary:  Negative for dysuria and urgency.  Musculoskeletal:  Negative for back pain, joint swelling and neck pain.  Skin:  Negative for rash.  Neurological:  Negative for dizziness, numbness and headaches.  Hematological:  Negative for adenopathy. Does not bruise/bleed easily.  Psychiatric/Behavioral:  Negative for agitation and confusion.      Past Medical History:  Diagnosis Date   Hypertension      Social History   Socioeconomic History   Marital status: Married    Spouse name: Not on file   Number of children: Not on file   Years of education: Not on file   Highest education level: Not on file  Occupational History   Occupation: retired  Tobacco Use   Smoking status: Former    Packs/day: 1.00    Years: 10.00    Pack years: 10.00    Types: Cigarettes    Start date: 1979    Quit date: 11/01/1996    Years since quitting: 24.2    Smokeless tobacco: Never  Substance and Sexual Activity   Alcohol use: Never   Drug use: Never   Sexual activity: Yes  Other Topics Concern   Not on file  Social History Narrative   Not on file   Social Determinants of Health   Financial Resource Strain: Not on file  Food Insecurity: Not on file  Transportation Needs: Not on file  Physical Activity: Not on file  Stress: Not on file  Social Connections: Not on file  Intimate Partner Violence: Not on file    No past surgical history on file.  No family history on file.  Allergies  Allergen Reactions   Penicillins Other (See Comments)    Current Outpatient Medications on File Prior to Visit  Medication Sig Dispense Refill   alendronate (FOSAMAX) 70 MG tablet Take 1 tablet (70 mg total) by mouth every 7 (seven) days. Take with a full glass of water on an empty stomach. 4 tablet 11   chlorthalidone (HYGROTON) 25 MG tablet Take 1 tablet (25 mg total) by mouth daily. 30 tablet 3   labetalol (NORMODYNE) 300 MG tablet Take 1 tablet (300 mg total) by mouth 2 (two) times daily. 60 tablet 11   diclofenac Sodium (VOLTAREN) 1 % GEL Apply 4 g topically 4 (four) times daily. To affected joint. 100 g 11  No current facility-administered medications on file prior to visit.    BP 140/67   Pulse 62   Resp 20   Ht 5\' 4"  (1.626 m)   Wt 228 lb (103.4 kg)   SpO2 94%   BMI 39.14 kg/m        Objective:   Physical Exam  General Mental Status- Alert. General Appearance- Not in acute distress.   Skin General: Color- Normal Color. Moisture- Normal Moisture.  Neck No JVD. Chest and Lung Exam Auscultation: Breath Sounds:-Normal.  Cardiovascular Auscultation:Rythm- Regular. Murmurs & Other Heart Sounds:Auscultation of the heart reveals- No Murmurs.  Neurologic Cranial Nerve exam:- CN III-XII intact(No nystagmus), symmetric smile. Strength:- 5/5 equal and symmetric strength both upper and lower extremities.       Assessment  & Plan:   Htn history. Recently much better control with addition of chlorthalidone.  Your bp is 120/78 most of the time. If you note lower reading or light headed then could change chlorthalidone to hctz. If we did this would decrease dose from 25 mg to 12.5 mg.   Remember to continue to eat potassium rich foods. If any muscle cramps notify .  Continue labetolol 300 mg twice daily.  Follow up in 3 months or sooner if needed.  [place], PA-C

## 2021-02-10 NOTE — Patient Instructions (Addendum)
Htn history. Recently much better control with addition of chlorthalidone.  Your bp is 120/78 most of the time. If you note lower reading or light headed then could change chlorthalidone to hctz. If we did this would decrease dose from 25 mg to 12.5 mg.   Remember to continue to eat potassium rich foods. If any muscle cramps notify us.  Continue labetolol 300 mg twice daily.  Follow up in 3 months or sooner if needed.

## 2021-02-20 DIAGNOSIS — D869 Sarcoidosis, unspecified: Secondary | ICD-10-CM | POA: Insufficient documentation

## 2021-02-27 ENCOUNTER — Other Ambulatory Visit (HOSPITAL_COMMUNITY)
Admission: RE | Admit: 2021-02-27 | Discharge: 2021-02-27 | Disposition: A | Discharge status: Home / Self Care | Payer: 59 | Source: Ambulatory Visit | Attending: Obstetrics & Gynecology | Admitting: Obstetrics & Gynecology

## 2021-02-27 ENCOUNTER — Other Ambulatory Visit: Payer: Self-pay

## 2021-02-27 ENCOUNTER — Encounter: Payer: Self-pay | Admitting: Obstetrics and Gynecology

## 2021-02-27 ENCOUNTER — Ambulatory Visit (INDEPENDENT_AMBULATORY_CARE_PROVIDER_SITE_OTHER): Payer: 59 | Admitting: Obstetrics and Gynecology

## 2021-02-27 VITALS — BP 140/69 | HR 66 | Ht 64.0 in | Wt 228.0 lb

## 2021-02-27 DIAGNOSIS — Z01411 Encounter for gynecological examination (general) (routine) with abnormal findings: Secondary | ICD-10-CM | POA: Diagnosis not present

## 2021-02-27 DIAGNOSIS — N950 Postmenopausal bleeding: Secondary | ICD-10-CM | POA: Diagnosis not present

## 2021-02-27 NOTE — Progress Notes (Signed)
Subjective:     Bianca Stephenson is a 65 y.o. female with BMI 39 who is here for a comprehensive physical exam. The patient reports persistent menses. She reports last episode of vaginal bleeding 4 months ago. She states that she never was amenorrheic for more than 10 months. She experiences vaginal bleeding every 3-4 months and it typically consists of vaginal spotting for a day. Patient is not sexually active. She denies any urinary incontinence. She denies pelvic pain or abnormal discharge. Patient with normal mammogram in May 2022.  Past Medical History:  Diagnosis Date   Hypertension    History reviewed. No pertinent surgical history. Family History  Problem Relation Age of Onset   Hypertension Mother    Alzheimer's disease Mother     Social History   Socioeconomic History   Marital status: Married    Spouse name: Not on file   Number of children: Not on file   Years of education: Not on file   Highest education level: Not on file  Occupational History   Occupation: retired  Tobacco Use   Smoking status: Former    Packs/day: 1.00    Years: 10.00    Pack years: 10.00    Types: Cigarettes    Start date: 1979    Quit date: 11/01/1996    Years since quitting: 24.3   Smokeless tobacco: Never  Substance and Sexual Activity   Alcohol use: Never   Drug use: Never   Sexual activity: Yes    Birth control/protection: None  Other Topics Concern   Not on file  Social History Narrative   Not on file   Social Determinants of Health   Financial Resource Strain: Not on file  Food Insecurity: Not on file  Transportation Needs: Not on file  Physical Activity: Not on file  Stress: Not on file  Social Connections: Not on file  Intimate Partner Violence: Not on file   Health Maintenance  Topic Date Due   HIV Screening  Never done   Hepatitis C Screening  Never done   TETANUS/TDAP  Never done   PAP SMEAR-Modifier  Never done   Zoster Vaccines- Shingrix (1 of 2) Never done    COVID-19 Vaccine (4 - Booster for Pfizer series) 10/04/2020   INFLUENZA VACCINE  02/03/2021   MAMMOGRAM  11/13/2022   COLONOSCOPY (Pts 45-58yrs Insurance coverage will need to be confirmed)  01/13/2027   Pneumococcal Vaccine 86-8 Years old  Aged Out   HPV VACCINES  Aged Out       Review of Systems Pertinent items noted in HPI and remainder of comprehensive ROS otherwise negative.   Objective:  Blood pressure 140/69, pulse 66, height 5\' 4"  (1.626 m), weight 228 lb (103.4 kg).   GENERAL: Well-developed, well-nourished female in no acute distress.  HEENT: Normocephalic, atraumatic. Sclerae anicteric.  NECK: Supple. Normal thyroid.  LUNGS: Clear to auscultation bilaterally.  HEART: Regular rate and rhythm. BREASTS: Symmetric in size. No palpable masses or lymphadenopathy, skin changes, or nipple drainage. ABDOMEN: Soft, nontender, nondistended. obese. PELVIC: Normal external female genitalia. Vagina is pink and rugated.  Normal discharge. Normal appearing cervix. Bimanual exam limited secondary to body habitus. No adnexal mass or tenderness. EXTREMITIES: No cyanosis, clubbing, or edema, 2+ distal pulses.     Assessment:    Healthy female exam.      Plan:    Pap smear collected TSH, FSH and LH ordered to evaluate vaginal bleeding Pelvic ultrasound also ordered Patient will be contacted with abnormal  results See After Visit Summary for Counseling Recommendations

## 2021-02-28 ENCOUNTER — Ambulatory Visit (HOSPITAL_BASED_OUTPATIENT_CLINIC_OR_DEPARTMENT_OTHER)
Admission: RE | Admit: 2021-02-28 | Discharge: 2021-02-28 | Disposition: A | Discharge status: Home / Self Care | Payer: 59 | Source: Ambulatory Visit | Attending: Obstetrics and Gynecology | Admitting: Obstetrics and Gynecology

## 2021-02-28 DIAGNOSIS — N950 Postmenopausal bleeding: Secondary | ICD-10-CM | POA: Diagnosis present

## 2021-02-28 DIAGNOSIS — Z01411 Encounter for gynecological examination (general) (routine) with abnormal findings: Secondary | ICD-10-CM | POA: Diagnosis present

## 2021-02-28 LAB — FOLLICLE STIMULATING HORMONE: FSH: 46.3 m[IU]/mL

## 2021-02-28 LAB — CYTOLOGY - PAP
Comment: NEGATIVE
Diagnosis: NEGATIVE
High risk HPV: NEGATIVE

## 2021-02-28 LAB — LUTEINIZING HORMONE: LH: 29.3 m[IU]/mL

## 2021-02-28 LAB — TSH: TSH: 4.05 u[IU]/mL (ref 0.450–4.500)

## 2021-03-03 MED ORDER — MISOPROSTOL 200 MCG PO TABS: ORAL_TABLET | ORAL | 1 refills | Status: DC

## 2021-03-03 NOTE — Addendum Note (Signed)
Addended by: Catalina Antigua on: 03/03/2021 09:42 AM   Modules accepted: Orders

## 2021-03-05 NOTE — Progress Notes (Signed)
Second TC to patient. Patient notified of MD recommendation to come in for EMB. Patient states she is not bleeding now and has not bled for 5 months. Plans to schedule appointment if she start bleeding again. Copy of MD message sent to patient via MyChart.

## 2021-04-03 ENCOUNTER — Encounter: Payer: Self-pay | Admitting: Pulmonary Disease

## 2021-04-03 ENCOUNTER — Ambulatory Visit (INDEPENDENT_AMBULATORY_CARE_PROVIDER_SITE_OTHER): Payer: 59 | Admitting: Pulmonary Disease

## 2021-04-03 ENCOUNTER — Other Ambulatory Visit: Payer: Self-pay

## 2021-04-03 VITALS — BP 110/80 | HR 70 | Temp 98.0°F | Ht 63.0 in | Wt 229.8 lb

## 2021-04-03 DIAGNOSIS — D869 Sarcoidosis, unspecified: Secondary | ICD-10-CM

## 2021-04-03 DIAGNOSIS — R0602 Shortness of breath: Secondary | ICD-10-CM | POA: Diagnosis not present

## 2021-04-03 DIAGNOSIS — R942 Abnormal results of pulmonary function studies: Secondary | ICD-10-CM

## 2021-04-03 LAB — PULMONARY FUNCTION TEST
DL/VA % pred: 94 %
DL/VA: 3.96 ml/min/mmHg/L
DLCO cor % pred: 79 %
DLCO cor: 15.37 ml/min/mmHg
DLCO unc % pred: 79 %
DLCO unc: 15.37 ml/min/mmHg
FEF 25-75 Post: 1.34 L/sec
FEF 25-75 Pre: 1.29 L/sec
FEF2575-%Change-Post: 3 %
FEF2575-%Pred-Post: 63 %
FEF2575-%Pred-Pre: 60 %
FEV1-%Change-Post: 1 %
FEV1-%Pred-Post: 77 %
FEV1-%Pred-Pre: 76 %
FEV1-Post: 1.83 L
FEV1-Pre: 1.8 L
FEV1FVC-%Change-Post: 3 %
FEV1FVC-%Pred-Pre: 94 %
FEV6-%Change-Post: -1 %
FEV6-%Pred-Post: 82 %
FEV6-%Pred-Pre: 83 %
FEV6-Post: 2.42 L
FEV6-Pre: 2.47 L
FEV6FVC-%Pred-Post: 104 %
FEV6FVC-%Pred-Pre: 104 %
FVC-%Change-Post: -1 %
FVC-%Pred-Post: 78 %
FVC-%Pred-Pre: 80 %
FVC-Post: 2.42 L
FVC-Pre: 2.47 L
Post FEV1/FVC ratio: 75 %
Post FEV6/FVC ratio: 100 %
Pre FEV1/FVC ratio: 73 %
Pre FEV6/FVC Ratio: 100 %
RV % pred: 83 %
RV: 1.69 L
TLC % pred: 88 %
TLC: 4.33 L

## 2021-04-03 MED ORDER — ALBUTEROL SULFATE HFA 108 (90 BASE) MCG/ACT IN AERS: 2.0000 | INHALATION_SPRAY | Freq: Four times a day (QID) | RESPIRATORY_TRACT | 2 refills | Status: DC | PRN

## 2021-04-03 NOTE — Progress Notes (Signed)
PFT done today. 

## 2021-04-03 NOTE — Patient Instructions (Addendum)
Sarcoid - ORDER CBC and CMET  Shortness of breath - START Albuterol AS NEEDED for shortness of breath or wheezing  Follow-up with me 7 months

## 2021-04-03 NOTE — Progress Notes (Signed)
Subjective:   PATIENT ID: Bianca Stephenson GENDER: female DOB: 1955/12/28, MRN: [id number]   HPI  Chief Complaint  Patient presents with   Follow-up    Sarcoidosis    Reason for Visit: Follow-up for sarcoidosis  Bianca Stephenson is a 65 year old with sarcoidosis and hypertension who presents to establish Pulmonary care.  Synopsis: She was diagnosed skin biopsy 30 years ago. She was referred to Pulmonology in 1998 and had PFTs and underwent bronchoscopy five times. She was on prednisone and bronchodilators for period of time. She reports her symptoms at the time were shortness of breath with rare wheezing during exertion.  2022 - Established care at [facility]. Minimal respiratory symptoms.  04/03/21 Since our last visit she overall is doing well. Reports shortness of breath with exertion and rare wheezing with exertion. Tolerated PFTs. Denies cough, wheezing, leg swelling or chest pain.  Social History: Retired [employer] features editor  Past Medical History:  Diagnosis Date   Hypertension      Allergies  Allergen Reactions   Penicillins Other (See Comments)     Outpatient Medications Prior to Visit  Medication Sig Dispense Refill   alendronate (FOSAMAX) 70 MG tablet Take 1 tablet (70 mg total) by mouth every 7 (seven) days. Take with a full glass of water on an empty stomach. 4 tablet 11   chlorthalidone (HYGROTON) 25 MG tablet Take 1 tablet (25 mg total) by mouth daily. 30 tablet 3   diclofenac Sodium (VOLTAREN) 1 % GEL Apply 4 g topically 4 (four) times daily. To affected joint. (Patient not taking: Reported on 02/27/2021) 100 g 11   labetalol (NORMODYNE) 300 MG tablet Take 1 tablet (300 mg total) by mouth 2 (two) times daily. 60 tablet 11   misoprostol (CYTOTEC) 200 MCG tablet Insert four tablets vaginally the night prior to your appointment 4 tablet 1   No facility-administered medications prior to visit.    Review of Systems  Constitutional:  Negative for chills, diaphoresis,  fever, malaise/fatigue and weight loss.  HENT:  Negative for congestion.   Respiratory:  Positive for shortness of breath. Negative for cough, hemoptysis, sputum production and wheezing.   Cardiovascular:  Negative for chest pain, palpitations and leg swelling.    Objective:   Vitals:   04/03/21 1434  BP: 110/80  Pulse: 70  Temp: 98 F (36.7 C)  TempSrc: Oral  SpO2: 99%  Weight: 229 lb 12.8 oz (104.2 kg)  Height: 5\' 3"  (1.6 m)      Physical Exam: General: Well-appearing, no acute distress HENT: [place], AT Eyes: EOMI, no scleral icterus Respiratory: Clear to auscultation bilaterally.  No crackles, wheezing or rales Cardiovascular: RRR, -M/R/G, no JVD Extremities:-Edema,-tenderness Neuro: AAO x4, CNII-XII grossly intact Psych: Normal mood, normal affect   Data Reviewed:  Imaging: CXR 11/01/20 - Chronic interstitial lung changes  PFT: 04/03/21 FVC 2.42 (78%) FEV1 1.83 (77%) Ratio 73  TLC 88% DLCO 79% Interpretation: No evidence of obstructive or restrictive defect. F-V loops suggest small airway obstruction. Corrected DLCO with minimal reduction   Labs: CBC    Component Value Date/Time   WBC 8.3 01/21/2021 1417   RBC 4.16 01/21/2021 1417   HGB 12.4 01/21/2021 1417   HCT 36.3 01/21/2021 1417   PLT 243.0 01/21/2021 1417   MCV 87.4 01/21/2021 1417   MCHC 34.2 01/21/2021 1417   RDW 14.2 01/21/2021 1417   LYMPHSABS 1.3 01/21/2021 1417   MONOABS 0.8 01/21/2021 1417   EOSABS 0.1 01/21/2021 1417  BASOSABS 0.0 01/21/2021 1417   CMP Latest Ref Rng & Units 01/10/2021 11/01/2020  Glucose 65 - 99 mg/dL 95 78  BUN 7 - 25 mg/dL 17 13  Creatinine 8.18 - 0.99 mg/dL 2.99(B) 7.16  Sodium 967 - 146 mmol/L 140 139  Potassium 3.5 - 5.3 mmol/L 4.5 4.2  Chloride 98 - 110 mmol/L 105 102  CO2 20 - 32 mmol/L 27 25  Calcium 8.6 - 10.4 mg/dL 9.4 9.8  Total Protein 6.1 - 8.1 g/dL 7.0 7.7  Total Bilirubin 0.2 - 1.2 mg/dL 0.4 0.4  AST 10 - 35 U/L 17 17  ALT 6 - 29 U/L 12 11    Quantiferon TB Gold Latest Ref Rng & Units 01/21/2021  Quantiferon TB Gold Plus NEGATIVE NEGATIVE   EKG 01/21/21 - Sinus bradycardia. Normal PR and Qtc interval.   Assessment & Plan:   Discussion: 65 year old female with hx of sarcoidosis who presents for follow-up. We reviewed PFTs. No evidence of obstructive or restrictive defect. F-V loops suggest small airway obstruction. Corrected DLCO with minimal reduction. We discussed PRN SABA for mild symptoms. Will hold on further work-up of borderline DLCO reduction. No evidence of hypoxemia in clinic.    We discussed the clinical course of sarcoid and management including serial PFTs, labs, eye exam, and EKG and chest imaging if indicated. If symptoms suggest sarcoid flare in the future, we would manage with steroids +/- biologics.   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  Due 03/2022 - Prefers to wait on Ophthalmology appointment when she has Medicaid.  - EKG reviewed. No evidence of conduction abnormalities.  - Last Cr minimally elevated. ORDER CBC and CMET--ADDENDUM. Cancelled. Will obtain at next visit  Shortness of breath - START Albuterol AS NEEDED for shortness of breath or wheezing  Reduced DLCO - Borderline low. No evidence of hypoxemia - Hold on further work-up until it progresses  Health Maintenance Immunization History  Administered Date(s) Administered   PFIZER(Purple Top)SARS-COV-2 Vaccination 09/21/2019, 10/13/2019, 06/05/2020   CT Lung Screen - not indicated  Orders Placed This Encounter  Procedures   CBC w/Diff    Standing Status:   Future    Standing Expiration Date:   04/03/2022   Comprehensive metabolic panel   Meds ordered this encounter  Medications   albuterol (VENTOLIN HFA) 108 (90 Base) MCG/ACT inhaler    Sig: Inhale 2 puffs into the lungs every 6 (six) hours as needed for wheezing or shortness of breath.     Dispense:  8 g    Refill:  2   Return in about 7 months (around 11/01/2021).  I have spent a total time of 35-minutes on the day of the appointment reviewing prior documentation, coordinating care and discussing medical diagnosis and plan with the patient/family. Past medical history, allergies, medications were reviewed. Pertinent imaging, labs and tests included in this note have been reviewed and interpreted independently by me.  Shantay Sonn Mechele Collin, MD [facility] 04/03/2021 11:38 AM  Office Number [phone]

## 2021-05-08 ENCOUNTER — Other Ambulatory Visit: Payer: Self-pay | Admitting: Medical

## 2021-05-13 ENCOUNTER — Ambulatory Visit (INDEPENDENT_AMBULATORY_CARE_PROVIDER_SITE_OTHER): Payer: 59 | Admitting: Medical

## 2021-05-13 ENCOUNTER — Other Ambulatory Visit: Payer: Self-pay

## 2021-05-13 VITALS — BP 130/58 | HR 65 | Resp 18 | Ht 63.0 in | Wt 230.0 lb

## 2021-05-13 DIAGNOSIS — Z23 Encounter for immunization: Secondary | ICD-10-CM | POA: Diagnosis not present

## 2021-05-13 DIAGNOSIS — I10 Essential (primary) hypertension: Secondary | ICD-10-CM | POA: Diagnosis not present

## 2021-05-13 DIAGNOSIS — R7989 Other specified abnormal findings of blood chemistry: Secondary | ICD-10-CM

## 2021-05-13 DIAGNOSIS — D869 Sarcoidosis, unspecified: Secondary | ICD-10-CM | POA: Diagnosis not present

## 2021-05-13 DIAGNOSIS — M8580 Other specified disorders of bone density and structure, unspecified site: Secondary | ICD-10-CM | POA: Diagnosis not present

## 2021-05-13 LAB — LIPID PANEL
Cholesterol: 212 mg/dL — ABNORMAL HIGH (ref 0–200)
HDL: 56.3 mg/dL (ref >39.00)
LDL Cholesterol: 134 mg/dL — ABNORMAL HIGH (ref 0–99)
NonHDL: 155.58
Total CHOL/HDL Ratio: 4
Triglycerides: 108 mg/dL (ref 0.0–149.0)
VLDL: 21.6 mg/dL (ref 0.0–40.0)

## 2021-05-13 LAB — COMPREHENSIVE METABOLIC PANEL
ALT: 14 U/L (ref 0–35)
AST: 19 U/L (ref 0–37)
Albumin: 4.1 g/dL (ref 3.5–5.2)
Alkaline Phosphatase: 47 U/L (ref 39–117)
BUN: 16 mg/dL (ref 6–23)
CO2: 32 mEq/L (ref 19–32)
Calcium: 9.7 mg/dL (ref 8.4–10.5)
Chloride: 97 mEq/L (ref 96–112)
Creatinine, Ser: 1.08 mg/dL (ref 0.40–1.20)
GFR: 54.21 mL/min — ABNORMAL LOW (ref >60.00)
Glucose, Bld: 90 mg/dL (ref 70–99)
Potassium: 3.7 mEq/L (ref 3.5–5.1)
Sodium: 137 mEq/L (ref 135–145)
Total Bilirubin: 0.5 mg/dL (ref 0.2–1.2)
Total Protein: 7 g/dL (ref 6.0–8.3)

## 2021-05-13 NOTE — Progress Notes (Signed)
Subjective:    Patient ID: Bianca Stephenson, female    DOB: 1956/04/19, 65 y.o.   MRN: [id number]  HPI  Htn- Pt bp controlled today. Her readings at home 120/70's consistently. On chlorthalidone and labetolol.  Pt on fosamax 70 mg weekly for osteopenia. I don't see prior bone density.  Sarcoidosis and seen by pulmonlogist. Below A/P in "  Discussion: 65 year old female with hx of sarcoidosis who presents for follow-up. We reviewed PFTs. No evidence of obstructive or restrictive defect. F-V loops suggest small airway obstruction. Corrected DLCO with minimal reduction. We discussed PRN SABA for mild symptoms. Will hold on further work-up of borderline DLCO reduction. No evidence of hypoxemia in clinic.     We discussed the clinical course of sarcoid and management including serial PFTs, labs, eye exam, and EKG and chest imaging if indicated. If symptoms suggest sarcoid flare in the future, we would manage with steroids +/- biologics.     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  Due 03/2022 - Prefers to wait on Ophthalmology appointment when she has Medicaid.  - EKG reviewed. No evidence of conduction abnormalities.  - Last Cr minimally elevated. ORDER CBC and CMET--ADDENDUM. Cancelled. Will obtain at next visit   Shortness of breath - START Albuterol AS NEEDED for shortness of breath or wheezing   Reduced DLCO - Borderline low. No evidence of hypoxemia - Hold on further work-up until it progresses  Pt got flu vaccine and covid vaccine about 3 weeks ago.    Review of Systems  Constitutional:  Negative for chills, fatigue and fever.  HENT:  Negative for congestion and drooling.   Respiratory:  Negative for cough, chest tightness, shortness of breath and wheezing.   Cardiovascular:  Negative for chest pain and palpitations.  Gastrointestinal:  Negative for abdominal pain.  Genitourinary:   Negative for dyspareunia, dysuria, flank pain and frequency.  Musculoskeletal:  Negative for back pain.  Skin:  Negative for rash.  Neurological:  Negative for dizziness, tremors, weakness, light-headedness and headaches.  Hematological:  Negative for adenopathy. Does not bruise/bleed easily.  Psychiatric/Behavioral:  Negative for behavioral problems and confusion.     Past Medical History:  Diagnosis Date   Hypertension      Social History   Socioeconomic History   Marital status: Married    Spouse name: Not on file   Number of children: Not on file   Years of education: Not on file   Highest education level: Not on file  Occupational History   Occupation: retired  Tobacco Use   Smoking status: Former    Packs/day: 1.00    Years: 10.00    Pack years: 10.00    Types: Cigarettes    Start date: 1979    Quit date: 11/01/1996    Years since quitting: 24.5   Smokeless tobacco: Never  Substance and Sexual Activity   Alcohol use: Never   Drug use: Never   Sexual activity: Yes    Birth control/protection: None  Other Topics Concern   Not on file  Social History Narrative   Not on file   Social Determinants of Health   Financial Resource Strain: Not on file  Food Insecurity: Not on file  Transportation Needs: Not on file  Physical Activity: Not on file  Stress: Not on file  Social Connections: Not on file  Intimate Partner Violence: Not on file  No past surgical history on file.  Family History  Problem Relation Age of Onset   Hypertension Mother    Alzheimer's disease Mother     Allergies  Allergen Reactions   Penicillins Other (See Comments)    Current Outpatient Medications on File Prior to Visit  Medication Sig Dispense Refill   albuterol (VENTOLIN HFA) 108 (90 Base) MCG/ACT inhaler Inhale 2 puffs into the lungs every 6 (six) hours as needed for wheezing or shortness of breath. 8 g 2   alendronate (FOSAMAX) 70 MG tablet Take 1 tablet (70 mg total) by  mouth every 7 (seven) days. Take with a full glass of water on an empty stomach. 4 tablet 11   chlorthalidone (HYGROTON) 25 MG tablet TAKE ONE TABLET BY MOUTH DAILY 30 tablet 3   diclofenac Sodium (VOLTAREN) 1 % GEL Apply 4 g topically 4 (four) times daily. To affected joint. 100 g 11   labetalol (NORMODYNE) 300 MG tablet Take 1 tablet (300 mg total) by mouth 2 (two) times daily. 60 tablet 11   misoprostol (CYTOTEC) 200 MCG tablet Insert four tablets vaginally the night prior to your appointment 4 tablet 1   No current facility-administered medications on file prior to visit.    BP (!) 130/58   Pulse 65   Resp 18   Ht 5\' 3"  (1.6 m)   Wt 230 lb (104.3 kg)   SpO2 94%   BMI 40.74 kg/m       Objective:   Physical Exam  General Mental Status- Alert. General Appearance- Not in acute distress.   Skin General: Color- Normal Color. Moisture- Normal Moisture.  Neck Carotid Arteries- Normal color. Moisture- Normal Moisture. No carotid bruits. No JVD.  Chest and Lung Exam Auscultation: Breath Sounds:-Normal.  Cardiovascular Auscultation:Rythm- Regular. Murmurs & Other Heart Sounds:Auscultation of the heart reveals- No Murmurs.  Abdomen Inspection:-Inspeection Normal. Palpation/Percussion:Note:No mass. Palpation and Percussion of the abdomen reveal- Non Tender, Non Distended + BS, no rebound or guarding.   Neurologic Cranial Nerve exam:- CN III-XII intact(No nystagmus), symmetric smile. Strength:- 5/5 equal and symmetric strength both upper and lower extremities.       Assessment & Plan:   Patient Instructions  Hypertension well controlled.  Continue chlorthalidone and labetalol.  We will get lipid panel to assess cardiovascular risk.  Osteopenia.  Continue Fosamax 70 mg weekly.  Sarcoidosis.  Continue albuterol as needed  For elevated creatinine we will repeat metabolic panel today.  Might be better since he described being well-hydrated.  Follow-up in 3 months or  sooner if needed.    , PA-C   Time spent with patient today was  24 minutes which consisted of chart review, discussing diagnosis, work up treatment and documentation.

## 2021-05-13 NOTE — Addendum Note (Signed)
Addended by: Maximino Sarin on: 05/13/2021 01:45 PM   Modules accepted: Orders

## 2021-05-13 NOTE — Patient Instructions (Addendum)
Hypertension well controlled.  Continue chlorthalidone and labetalol.  We will get lipid panel to assess cardiovascular risk.  Osteopenia.  Continue Fosamax 70 mg weekly.  Sarcoidosis.  Continue albuterol as needed  For elevated creatinine we will repeat metabolic panel today.  Might be better since he described being well-hydrated.  Follow-up in 3 months or sooner if needed.

## 2021-05-18 ENCOUNTER — Encounter: Payer: Self-pay | Admitting: Medical

## 2021-08-13 ENCOUNTER — Encounter: Payer: Self-pay | Admitting: Medical

## 2021-08-13 ENCOUNTER — Ambulatory Visit (INDEPENDENT_AMBULATORY_CARE_PROVIDER_SITE_OTHER): Payer: Medicare Other | Admitting: Medical

## 2021-08-13 VITALS — BP 118/70 | HR 65 | Resp 18 | Ht 63.0 in | Wt 225.0 lb

## 2021-08-13 DIAGNOSIS — R944 Abnormal results of kidney function studies: Secondary | ICD-10-CM

## 2021-08-13 DIAGNOSIS — D869 Sarcoidosis, unspecified: Secondary | ICD-10-CM

## 2021-08-13 DIAGNOSIS — I10 Essential (primary) hypertension: Secondary | ICD-10-CM

## 2021-08-13 DIAGNOSIS — E785 Hyperlipidemia, unspecified: Secondary | ICD-10-CM | POA: Diagnosis not present

## 2021-08-13 DIAGNOSIS — M8580 Other specified disorders of bone density and structure, unspecified site: Secondary | ICD-10-CM

## 2021-08-13 NOTE — Patient Instructions (Addendum)
Hypertension- blood pressure stable/good controlled. Continue chlorthalidone and labetolol.  For sarcoidosis continue to follow up with pulmonologist. Appears stable recently. No recent use albuterol  For decreased gfr will repeat cmp today.   For osteopenia continue fosamax.  Hyperlipidemia. Will repeat lipid panel today to see if improved diet lowered levels adequatley. If not then will likely recommend statin again.   Follow up date to be determined after lab review.

## 2021-08-13 NOTE — Progress Notes (Signed)
Subjective:    Patient ID: Bianca Stephenson, female    DOB: Jan 03, 1956, 66 y.o.   MRN: [id number]  HPI  Htn- Pt bp controlled today. Her readings at home 131/70  today. On chlorthalidone and labetolol  Sarcoidosis- pt seeing pulmonologist in April 29 per last A/P. Currently no respiratory diffuclty.  Pt last labs showed high chlesterol.  . The 10-year cardiovascular risk score: was 7.1%. had recommend statin. Pt wanted to try better diet first. Now in for repeat fasting lab. Dad did have hx of MI early 65 years of age.  GFR on last visit/lab was 54.21   Review of Systems  Constitutional:  Negative for chills, fatigue and fever.  Respiratory:  Negative for cough, chest tightness, shortness of breath and wheezing.   Cardiovascular:  Negative for chest pain and palpitations.  Gastrointestinal:  Negative for abdominal pain, constipation and nausea.  Musculoskeletal:  Negative for back pain and neck pain.  Skin:  Negative for rash.  Neurological:  Negative for dizziness and headaches.  Psychiatric/Behavioral:  Negative for behavioral problems, decreased concentration and hallucinations. The patient is not nervous/anxious.    Past Medical History:  Diagnosis Date   Hypertension      Social History   Socioeconomic History   Marital status: Married    Spouse name: Not on file   Number of children: Not on file   Years of education: Not on file   Highest education level: Not on file  Occupational History   Occupation: retired  Tobacco Use   Smoking status: Former    Packs/day: 1.00    Years: 10.00    Pack years: 10.00    Types: Cigarettes    Start date: 1979    Quit date: 11/01/1996    Years since quitting: 24.7   Smokeless tobacco: Never  Substance and Sexual Activity   Alcohol use: Never   Drug use: Never   Sexual activity: Yes    Birth control/protection: None  Other Topics Concern   Not on file  Social History Narrative   Not on file   Social Determinants of Health    Financial Resource Strain: Not on file  Food Insecurity: Not on file  Transportation Needs: Not on file  Physical Activity: Not on file  Stress: Not on file  Social Connections: Not on file  Intimate Partner Violence: Not on file    No past surgical history on file.  Family History  Problem Relation Age of Onset   Hypertension Mother    Alzheimer's disease Mother     Allergies  Allergen Reactions   Penicillins Other (See Comments)    Current Outpatient Medications on File Prior to Visit  Medication Sig Dispense Refill   albuterol (VENTOLIN HFA) 108 (90 Base) MCG/ACT inhaler Inhale 2 puffs into the lungs every 6 (six) hours as needed for wheezing or shortness of breath. 8 g 2   alendronate (FOSAMAX) 70 MG tablet Take 1 tablet (70 mg total) by mouth every 7 (seven) days. Take with a full glass of water on an empty stomach. 4 tablet 11   chlorthalidone (HYGROTON) 25 MG tablet TAKE ONE TABLET BY MOUTH DAILY 30 tablet 3   diclofenac Sodium (VOLTAREN) 1 % GEL Apply 4 g topically 4 (four) times daily. To affected joint. 100 g 11   labetalol (NORMODYNE) 300 MG tablet Take 1 tablet (300 mg total) by mouth 2 (two) times daily. 60 tablet 11   misoprostol (CYTOTEC) 200 MCG tablet Insert four tablets vaginally  the night prior to your appointment 4 tablet 1   No current facility-administered medications on file prior to visit.    BP 134/70    Pulse 65    Resp 18    Ht 5\' 3"  (1.6 m)    Wt 225 lb (102.1 kg)    SpO2 95%    BMI 39.86 kg/m        Objective:   Physical Exam  General Mental Status- Alert. General Appearance- Not in acute distress.   Skin General: Color- Normal Color. Moisture- Normal Moisture.   Chest and Lung Exam Auscultation: Breath Sounds:-Normal.  Cardiovascular Auscultation:Rythm- Regular. Murmurs & Other Heart Sounds:Auscultation of the heart reveals- No Murmurs.  Abdomen Inspection:-Inspeection Normal. Palpation/Percussion:Note:No mass. Palpation and  Percussion of the abdomen reveal- Non Tender, Non Distended + BS, no rebound or guarding.   Neurologic Cranial Nerve exam:- CN III-XII intact(No nystagmus), symmetric smile. Strength:- 5/5 equal and symmetric strength both upper and lower extremities.       Assessment & Plan:   Patient Instructions  Hypertension- blood pressure stable/good controlled. Continue chlorthalidone and labetolol.  For sarcoidosis continue to follow up with pulmonologist. Appears stable recently. No recent use albuterol  For decreased gfr will repeat cmp today.   For osteopenia continue fosamax.  Hyperlipidemia. Will repeat lipid panel today to see if improved diet lowered levels adequatley. If not then will likely recommend statin again.   Follow up date to be determined after lab review.    , PA-C

## 2021-08-14 LAB — COMPREHENSIVE METABOLIC PANEL
ALT: 13 U/L (ref 0–35)
AST: 19 U/L (ref 0–37)
Albumin: 4.2 g/dL (ref 3.5–5.2)
Alkaline Phosphatase: 45 U/L (ref 39–117)
BUN: 16 mg/dL (ref 6–23)
CO2: 35 mEq/L — ABNORMAL HIGH (ref 19–32)
Calcium: 9.6 mg/dL (ref 8.4–10.5)
Chloride: 95 mEq/L — ABNORMAL LOW (ref 96–112)
Creatinine, Ser: 1.11 mg/dL (ref 0.40–1.20)
GFR: 52.36 mL/min — ABNORMAL LOW (ref >60.00)
Glucose, Bld: 101 mg/dL — ABNORMAL HIGH (ref 70–99)
Potassium: 3.9 mEq/L (ref 3.5–5.1)
Sodium: 135 mEq/L (ref 135–145)
Total Bilirubin: 0.5 mg/dL (ref 0.2–1.2)
Total Protein: 7.1 g/dL (ref 6.0–8.3)

## 2021-08-14 LAB — LIPID PANEL
Cholesterol: 206 mg/dL — ABNORMAL HIGH (ref 0–200)
HDL: 57.3 mg/dL (ref >39.00)
LDL Cholesterol: 130 mg/dL — ABNORMAL HIGH (ref 0–99)
NonHDL: 148.49
Total CHOL/HDL Ratio: 4
Triglycerides: 90 mg/dL (ref 0.0–149.0)
VLDL: 18 mg/dL (ref 0.0–40.0)

## 2021-08-26 ENCOUNTER — Other Ambulatory Visit: Payer: Self-pay | Admitting: Medical

## 2021-10-20 ENCOUNTER — Other Ambulatory Visit: Payer: Self-pay | Admitting: Medical

## 2021-12-16 ENCOUNTER — Other Ambulatory Visit: Payer: Self-pay | Admitting: Medical

## 2021-12-22 ENCOUNTER — Other Ambulatory Visit (HOSPITAL_BASED_OUTPATIENT_CLINIC_OR_DEPARTMENT_OTHER): Payer: Self-pay | Admitting: Medical

## 2021-12-22 DIAGNOSIS — Z1231 Encounter for screening mammogram for malignant neoplasm of breast: Secondary | ICD-10-CM

## 2021-12-24 ENCOUNTER — Telehealth: Payer: Self-pay

## 2021-12-24 NOTE — Telephone Encounter (Signed)
Left message for patient to call back and schedule Medicare Annual Wellness Visit (AWV) in office.    If not able to come in office, please offer to do virtually or by telephone.  Left office number to schedule Due for AWVI   Please schedule at anytime with Nurse Health Advisor 

## 2021-12-25 ENCOUNTER — Ambulatory Visit (INDEPENDENT_AMBULATORY_CARE_PROVIDER_SITE_OTHER): Payer: Medicare Other

## 2021-12-25 DIAGNOSIS — Z1231 Encounter for screening mammogram for malignant neoplasm of breast: Secondary | ICD-10-CM | POA: Diagnosis not present

## 2022-04-02 ENCOUNTER — Ambulatory Visit
Admission: RE | Admit: 2022-04-02 | Discharge: 2022-04-02 | Disposition: A | Discharge status: Home / Self Care | Payer: Medicare Other | Source: Ambulatory Visit | Attending: Physician Assistant | Admitting: Physician Assistant

## 2022-04-02 ENCOUNTER — Ambulatory Visit (INDEPENDENT_AMBULATORY_CARE_PROVIDER_SITE_OTHER): Payer: Medicare Other

## 2022-04-02 VITALS — BP 138/85 | HR 66 | Temp 97.9°F | Resp 17

## 2022-04-02 DIAGNOSIS — S63501A Unspecified sprain of right wrist, initial encounter: Secondary | ICD-10-CM | POA: Diagnosis not present

## 2022-04-02 DIAGNOSIS — S20211A Contusion of right front wall of thorax, initial encounter: Secondary | ICD-10-CM

## 2022-04-02 DIAGNOSIS — W19XXXA Unspecified fall, initial encounter: Secondary | ICD-10-CM

## 2022-04-02 DIAGNOSIS — R0781 Pleurodynia: Secondary | ICD-10-CM | POA: Diagnosis not present

## 2022-04-02 MED ORDER — LIDOCAINE 5 % EX PTCH: 1.0000 | MEDICATED_PATCH | CUTANEOUS | 0 refills | Status: DC

## 2022-04-02 NOTE — Discharge Instructions (Signed)
Your x-ray was normal with no evidence of fracture which is great news.  I am concerned that you have was called a contusion or a bad bone bruise.  Continue over-the-counter medications including Tylenol and ibuprofen as needed for pain.  I have called in lidocaine patches to help with your pain.  1 patch on during the day for 12 hours and then remove it for 12 hours (usually 1 patch per 24 hours).  If your symptoms are changing or worsening in any way please return for reevaluation.

## 2022-04-02 NOTE — ED Triage Notes (Signed)
Pt c/o RT rib pain since last Monday [place] the park. Stepped in a hole and fell hurting her wrist and RT ribs. States no bruising. Previous rib fx in LT side. States wrist is feeling better. Pain worse when laying.

## 2022-04-02 NOTE — ED Provider Notes (Signed)
[facility]    CSN: [id number] Arrival date & time: 04/02/22  1052      History   Chief Complaint Chief Complaint  Patient presents with   Rib Injury    RT    HPI Bianca Stephenson is a 66 y.o. female.   Patient presents today for evaluation of right rib pain following fall that occurred approximately 10 days ago.  Reports that she lost her balance while walking in the park and fell onto an outstretched right hand and right side.  She does not believe that she hit her head and denies any headache, nausea, vomiting, dizziness, amnesia surrounding event, loss of consciousness.  She does not take blood thinning medication on a regular basis.  She did initially have right wrist pain but reports that this is significantly improved and she has full range of motion without any ongoing pain.  She denies any numbness or tingling in the hand.  She is left-handed.  She reports that her rib pain is rated 2 on a 0 to pain scale at rest but increases with palpation, described as aching with periodic sharp pains associated with palpation, no aggravating or alleviating factors".  She has tried over-the-counter medications including Tylenol ibuprofen without improvement of symptoms.  Denies any shortness of breath, cough, hemoptysis.    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 No pertinent surgical history.  OB History     Gravida  0   Para  0   Term  0   Preterm  0   AB  0   Living  0      SAB  0   IAB  0   Ectopic  0   Multiple  0   Live Births  0            Home Medications    Prior to Admission medications   Medication Sig Start Date End Date Taking? Authorizing Provider  lidocaine (LIDODERM) 5 % Place 1 patch onto the skin daily. Remove & Discard  patch within 12 hours or as directed by MD 04/02/22  Yes Lakethia Coppess, Junie Panning K, PA-C  albuterol (VENTOLIN HFA) 108 (90 Base) MCG/ACT inhaler Inhale 2 puffs into the lungs every 6 (six) hours as needed for wheezing or shortness of breath. 04/03/21   Margaretha Seeds, MD  alendronate (FOSAMAX) 70 MG tablet TAKE 1 TABLET ( 70MG  TOTAL) BY MOUTH EVERY 7 DAYS. TAKE WITH A FULL GLASS OF WATER ON AN EMPTY STOMACH 10/20/21   Saguier, Percell Miller, PA-C  chlorthalidone (HYGROTON) 25 MG tablet TAKE ONE TABLET BY MOUTH DAILY 12/16/21   Saguier, Percell Miller, PA-C  diclofenac Sodium (VOLTAREN) 1 % GEL Apply 4 g topically 4 (four) times daily. To affected joint. 11/12/20   Rosemarie Ax, MD  labetalol (NORMODYNE) 300 MG tablet TAKE 1 TABLET ( 300 MG TOTAL) BY MOUTH TWO TIMES A DAY DAILY 10/20/21   Saguier, Percell Miller, PA-C  misoprostol (CYTOTEC) 200 MCG tablet Insert four tablets vaginally the night prior to your appointment 03/03/21   Constant, Vickii Chafe, MD    Family History Family History  Problem Relation Age of Onset   Hypertension Mother    Alzheimer's disease Mother     Social History Social History   Tobacco Use  Smoking status: Former    Packs/day: 1.00    Years: 10.00    Total pack years: 10.00    Types: Cigarettes    Start date: 62    Quit date: 11/01/1996    Years since quitting: 25.4   Smokeless tobacco: Never  Substance Use Topics   Alcohol use: Never   Drug use: Never     Allergies   Penicillins   Review of Systems Review of Systems  Constitutional:  Positive for activity change. Negative for appetite change, fatigue and fever.  Eyes:  Negative for visual disturbance.  Respiratory:  Negative for cough, chest tightness, shortness of breath and wheezing.   Cardiovascular:  Positive for chest pain (chest wall).  Gastrointestinal:  Negative for abdominal pain, diarrhea, nausea and vomiting.  Neurological:  Negative for dizziness, light-headedness and headaches.     Physical Exam Triage Vital  Signs ED Triage Vitals  Enc Vitals Group     BP 04/02/22 1103 138/85     Pulse Rate 04/02/22 1103 66     Resp 04/02/22 1103 17     Temp 04/02/22 1103 97.9 F (36.6 C)     Temp Source 04/02/22 1103 Oral     SpO2 04/02/22 1103 97 %     Weight --      Height --      Head Circumference --      Peak Flow --      Pain Score 04/02/22 1102 0     Pain Loc --      Pain Edu? --      Excl. in [place]? --    No data found.  Updated Vital Signs BP 138/85 (BP Location: Left Arm)   Pulse 66   Temp 97.9 F (36.6 C) (Oral)   Resp 17   SpO2 97%   Visual Acuity Right Eye Distance:   Left Eye Distance:   Bilateral Distance:    Right Eye Near:   Left Eye Near:    Bilateral Near:     Physical Exam Vitals reviewed.  Constitutional:      General: She is awake. She is not in acute distress.    Appearance: Normal appearance. She is well-developed. She is not ill-appearing.     Comments: Very pleasant female appears stated age in no acute distress sitting comfortably in exam room  HENT:     Head: Normocephalic and atraumatic.  Cardiovascular:     Rate and Rhythm: Normal rate and regular rhythm.     Heart sounds: Normal heart sounds, S1 normal and S2 normal. No murmur heard. Pulmonary:     Effort: Pulmonary effort is normal.     Breath sounds: Normal breath sounds. No wheezing, rhonchi or rales.     Comments: Clear to auscultation bilaterally Chest:     Chest wall: Tenderness present. No deformity or swelling.    Abdominal:     General: Bowel sounds are normal.     Palpations: Abdomen is soft.     Tenderness: There is no abdominal tenderness. There is no right CVA tenderness, left CVA tenderness, guarding or rebound.  Musculoskeletal:     Right wrist: No swelling, deformity, tenderness, bony tenderness or snuff box tenderness. Normal range of motion.     Comments: Right wrist/hand: No swelling or deformity noted.  Normal active range of motion.  Hand neurovascularly intact.  No snuffbox  tenderness.  Psychiatric:        Behavior: Behavior is cooperative.  UC Treatments / Results  Labs (all labs ordered are listed, but only abnormal results are displayed) Labs Reviewed - No data to display  EKG   Radiology DG Ribs Unilateral W/Chest Right  Result Date: 04/02/2022 CLINICAL DATA:  Pain after fall 10 days ago. Injury to RIGHT anterior lower ribs. EXAM: RIGHT RIBS AND CHEST - 3+ VIEW COMPARISON:  Chest x-ray dated 11/01/2020. FINDINGS: Heart size and mediastinal contours are within normal limits. Coarse lung markings are seen bilaterally, stable compared to the previous exam. No confluence opacity to suggest a superimposed pneumonia or pulmonary edema. No pleural effusion or pneumothorax is seen. Osseous structures about the chest are unremarkable. No RIGHT-sided rib fracture or displacement is seen. IMPRESSION: 1. No active disease. No evidence of pneumonia or pulmonary edema. No pleural effusion or pneumothorax. 2. No rib fracture or displacement seen. 3. Probable chronic interstitial lung disease. Electronically Signed   By: Franki Cabot M.D.   On: 04/02/2022 11:24    Procedures Procedures (including critical care time)  Medications Ordered in UC Medications - No data to display  Initial Impression / Assessment and Plan / UC Course  I have reviewed the triage vital signs and the nursing notes.  Pertinent labs & imaging results that were available during my care of the patient were reviewed by me and considered in my medical decision making (see chart for details).     No indication for head or neck CT based on clinical presentation and fall occurred 10 days ago patient has no red flag signs/symptoms.  We discussed potential utility of x-ray of right wrist given mechanism of injury, however, given she has no bony tenderness, deformity, pain this was deferred.  Recommended she continue over-the-counter medications and RICE protocol as needed for any discomfort.   X-ray of right ribs was obtained given point tenderness with no acute osseous abnormality.  Discussed contusion as likely etiology.  Recommended over-the-counter medication for symptom management.  She was prescribed lidocaine patches for additional symptom relief with instruction to use only 1 patch per 24 hours.  Discussed that if she has any worsening symptoms including chest pain, shortness of breath she should be seen immediately.  Recommended close follow-up with her primary care.  Strict return precautions given.  Work excuse note provided.  Final Clinical Impressions(s) / UC Diagnoses   Final diagnoses:  Contusion of rib on right side, initial encounter  Sprain of right wrist, initial encounter  Fall, initial encounter     Discharge Instructions      Your x-ray was normal with no evidence of fracture which is great news.  I am concerned that you have was called a contusion or a bad bone bruise.  Continue over-the-counter medications including Tylenol and ibuprofen as needed for pain.  I have called in lidocaine patches to help with your pain.  1 patch on during the day for 12 hours and then remove it for 12 hours (usually 1 patch per 24 hours).  If your symptoms are changing or worsening in any way please return for reevaluation.     ED Prescriptions     Medication Sig Dispense Auth. Provider   lidocaine (LIDODERM) 5 % Place 1 patch onto the skin daily. Remove & Discard patch within 12 hours or as directed by MD 30 patch Chrisma Hurlock K, PA-C      PDMP not reviewed this encounter.   Terrilee Croak, PA-C 04/02/22 1146

## 2022-04-21 ENCOUNTER — Other Ambulatory Visit: Payer: Self-pay | Admitting: Medical

## 2022-06-02 ENCOUNTER — Telehealth: Payer: Self-pay | Admitting: *Deleted

## 2022-06-02 NOTE — Telephone Encounter (Signed)
Karin Golden called to see if patient was ok to get RSV vaccine.  Vaccine is ok to give per [facility].

## 2022-07-28 ENCOUNTER — Other Ambulatory Visit (HOSPITAL_BASED_OUTPATIENT_CLINIC_OR_DEPARTMENT_OTHER): Payer: Self-pay

## 2022-07-28 ENCOUNTER — Ambulatory Visit (INDEPENDENT_AMBULATORY_CARE_PROVIDER_SITE_OTHER): Payer: Medicare Other | Admitting: Medical

## 2022-07-28 VITALS — BP 121/74 | HR 70 | Temp 98.0°F | Resp 18 | Ht 63.0 in | Wt 224.0 lb

## 2022-07-28 DIAGNOSIS — Z23 Encounter for immunization: Secondary | ICD-10-CM | POA: Diagnosis not present

## 2022-07-28 DIAGNOSIS — M8580 Other specified disorders of bone density and structure, unspecified site: Secondary | ICD-10-CM

## 2022-07-28 DIAGNOSIS — I10 Essential (primary) hypertension: Secondary | ICD-10-CM

## 2022-07-28 DIAGNOSIS — E785 Hyperlipidemia, unspecified: Secondary | ICD-10-CM

## 2022-07-28 DIAGNOSIS — R944 Abnormal results of kidney function studies: Secondary | ICD-10-CM | POA: Diagnosis not present

## 2022-07-28 LAB — CBC WITH DIFFERENTIAL/PLATELET
Basophils Absolute: 0 10*3/uL (ref 0.0–0.1)
Basophils Relative: 0.3 % (ref 0.0–3.0)
Eosinophils Absolute: 0.1 10*3/uL (ref 0.0–0.7)
Eosinophils Relative: 1.6 % (ref 0.0–5.0)
HCT: 35.6 % — ABNORMAL LOW (ref 36.0–46.0)
Hemoglobin: 12 g/dL (ref 12.0–15.0)
Lymphocytes Relative: 20.7 % (ref 12.0–46.0)
Lymphs Abs: 1.5 10*3/uL (ref 0.7–4.0)
MCHC: 33.7 g/dL (ref 30.0–36.0)
MCV: 89 fl (ref 78.0–100.0)
Monocytes Absolute: 0.6 10*3/uL (ref 0.1–1.0)
Monocytes Relative: 8.6 % (ref 3.0–12.0)
Neutro Abs: 4.8 10*3/uL (ref 1.4–7.7)
Neutrophils Relative %: 68.8 % (ref 43.0–77.0)
Platelets: 265 10*3/uL (ref 150.0–400.0)
RBC: 4.01 Mil/uL (ref 3.87–5.11)
RDW: 14.7 % (ref 11.5–15.5)
WBC: 7 10*3/uL (ref 4.0–10.5)

## 2022-07-28 LAB — COMPREHENSIVE METABOLIC PANEL
ALT: 14 U/L (ref 0–35)
AST: 19 U/L (ref 0–37)
Albumin: 4.4 g/dL (ref 3.5–5.2)
Alkaline Phosphatase: 44 U/L (ref 39–117)
BUN: 13 mg/dL (ref 6–23)
CO2: 30 mEq/L (ref 19–32)
Calcium: 9.4 mg/dL (ref 8.4–10.5)
Chloride: 94 mEq/L — ABNORMAL LOW (ref 96–112)
Creatinine, Ser: 0.99 mg/dL (ref 0.40–1.20)
GFR: 59.66 mL/min — ABNORMAL LOW (ref >60.00)
Glucose, Bld: 92 mg/dL (ref 70–99)
Potassium: 3.7 mEq/L (ref 3.5–5.1)
Sodium: 134 mEq/L — ABNORMAL LOW (ref 135–145)
Total Bilirubin: 0.6 mg/dL (ref 0.2–1.2)
Total Protein: 7.3 g/dL (ref 6.0–8.3)

## 2022-07-28 LAB — LIPID PANEL
Cholesterol: 227 mg/dL — ABNORMAL HIGH (ref 0–200)
HDL: 59.4 mg/dL (ref >39.00)
LDL Cholesterol: 146 mg/dL — ABNORMAL HIGH (ref 0–99)
NonHDL: 167.13
Total CHOL/HDL Ratio: 4
Triglycerides: 107 mg/dL (ref 0.0–149.0)
VLDL: 21.4 mg/dL (ref 0.0–40.0)

## 2022-07-28 MED ORDER — LABETALOL HCL 300 MG PO TABS
300.0000 mg | ORAL_TABLET | Freq: Two times a day (BID) | ORAL | 11 refills | Status: DC
  Filled 2022-07-28: qty 60, 30d supply, fill #0
  Filled 2022-08-23 – 2022-09-14 (×3): qty 60, 30d supply, fill #1

## 2022-07-28 NOTE — Patient Instructions (Signed)
1. Hypertension, unspecified type Sent your labetolol down stairs to our pharmacy at [facility] Continue chlorthalidone. Bp elevated now but not on current bp  meds. Check bp tomorrow and update me by my chart on bp reading.  - CBC w/Diff - Comp Met (CMET)  2. Hyperlipidemia, unspecified hyperlipidemia type Will get lipid panel and may advise medication based on risk score.  - Comp Met (CMET) - Lipid panel  3. Decreased GFR Follow kidney function. - Comp Met (CMET)  4. Osteopenia, unspecified location Continue alondrenate. Can go down stairs to get set up for scan. - HM DEXA SCAN   Pcv 20 vaccine today. Can get shingrix thru pharmacy.  Follow up date to be determined after lab review.

## 2022-07-28 NOTE — Addendum Note (Signed)
Addended by: Jeronimo Greaves on: 07/28/2022 11:02 AM   Modules accepted: Orders

## 2022-07-28 NOTE — Progress Notes (Signed)
Subjective:    Patient ID: Bianca Stephenson, female    DOB: 04-13-1956, 67 y.o.   MRN: [id number]  HPI  Pt in for follow up.  Htn- pt ran out of labetolol the on Sunday and she did not take chlorthadlidone this morning since she was fasting?  High cholesterol in the past- pt not on cholesterol   Dad had mi in 20 or 64' years of age.  Osteopenia on review per pt.- she is on fosamax. On that before pt here.   Pt states never got shingles vaccine.  No recent pneumonia vaccine. Remember when late 30's got one after sarcoid dx.  The 10-year ASCVD risk score (Arnett DK, et al., 2019) is: 6.9%   Values used to calculate the score:     Age: 37 years     Sex: Female     Is Non-Hispanic African American: No     Diabetic: No     Tobacco smoker: No     Systolic Blood Pressure: 443 mmHg     Is BP treated: Yes     HDL Cholesterol: 59.4 mg/dL     Total Cholesterol: 227 mg/dL      Review of Systems  Constitutional:  Negative for chills, fatigue and fever.  HENT:  Negative for congestion, drooling, ear discharge and ear pain.   Respiratory:  Negative for cough, chest tightness, shortness of breath and wheezing.   Cardiovascular:  Negative for chest pain and palpitations.  Gastrointestinal:  Negative for abdominal pain, anal bleeding and blood in stool.  Musculoskeletal:  Negative for back pain and joint swelling.  Skin:  Negative for rash.  Neurological:  Negative for dizziness, speech difficulty, weakness, light-headedness and headaches.  Hematological:  Negative for adenopathy. Does not bruise/bleed easily.  Psychiatric/Behavioral:  Negative for behavioral problems and confusion.    Past Medical History:  Diagnosis Date   Hypertension      Social History   Socioeconomic History   Marital status: Married    Spouse name: Not on file   Number of children: Not on file   Years of education: Not on file   Highest education level: Not on file  Occupational History   Occupation:  retired  Tobacco Use   Smoking status: Former    Packs/day: 1.00    Years: 10.00    Total pack years: 10.00    Types: Cigarettes    Start date: 1979    Quit date: 11/01/1996    Years since quitting: 25.7   Smokeless tobacco: Never  Substance and Sexual Activity   Alcohol use: Never   Drug use: Never   Sexual activity: Yes    Birth control/protection: None  Other Topics Concern   Not on file  Social History Narrative   Not on file   Social Determinants of Health   Financial Resource Strain: Not on file  Food Insecurity: Not on file  Transportation Needs: Not on file  Physical Activity: Not on file  Stress: Not on file  Social Connections: Not on file  Intimate Partner Violence: Not on file    No past surgical history on file.  Family History  Problem Relation Age of Onset   Hypertension Mother    Alzheimer's disease Mother     Allergies  Allergen Reactions   Penicillins Other (See Comments)    Current Outpatient Medications on File Prior to Visit  Medication Sig Dispense Refill   albuterol (VENTOLIN HFA) 108 (90 Base) MCG/ACT inhaler Inhale 2 puffs into  the lungs every 6 (six) hours as needed for wheezing or shortness of breath. 8 g 2   alendronate (FOSAMAX) 70 MG tablet TAKE 1 TABLET ( 70MG  TOTAL) BY MOUTH EVERY 7 DAYS. TAKE WITH A FULL GLASS OF WATER ON AN EMPTY STOMACH 4 tablet 11   chlorthalidone (HYGROTON) 25 MG tablet TAKE 1 TABLET BY MOUTH DAILY 30 tablet 3   diclofenac Sodium (VOLTAREN) 1 % GEL Apply 4 g topically 4 (four) times daily. To affected joint. 100 g 11   lidocaine (LIDODERM) 5 % Place 1 patch onto the skin daily. Remove & Discard patch within 12 hours or as directed by MD 30 patch 0   misoprostol (CYTOTEC) 200 MCG tablet Insert four tablets vaginally the night prior to your appointment 4 tablet 1   No current facility-administered medications on file prior to visit.    BP (!) 158/80   Pulse 70   Temp 98 F (36.7 C)   Resp 18   Ht 5\' 3"   (1.6 m)   Wt 224 lb (101.6 kg)   SpO2 90%   BMI 39.68 kg/m           Objective:   Physical Exam  General Mental Status- Alert. General Appearance- Not in acute distress.   Skin General: Color- Normal Color. Moisture- Normal Moisture.  Neck Carotid Arteries- Normal color. Moisture- Normal Moisture. No carotid bruits. No JVD.  Chest and Lung Exam Auscultation: Breath Sounds:-Normal.  Cardiovascular Auscultation:Rythm- Regular. Murmurs & Other Heart Sounds:Auscultation of the heart reveals- No Murmurs.  Abdomen Inspection:-Inspeection Normal. Palpation/Percussion:Note:No mass. Palpation and Percussion of the abdomen reveal- Non Tender, Non Distended + BS, no rebound or guarding.   Neurologic Cranial Nerve exam:- CN III-XII intact(No nystagmus), symmetric smile. Strength:- 5/5 equal and symmetric strength both upper and lower extremities.       Assessment & Plan:   Patient Instructions  1. Hypertension, unspecified type Sent your labetolol down stairs to our pharmacy at [facility] Continue chlorthalidone. Bp elevated now but not on current bp  meds. Check bp tomorrow and update me by my chart on bp reading.  - CBC w/Diff - Comp Met (CMET)  2. Hyperlipidemia, unspecified hyperlipidemia type Will get lipid panel and may advise medication based on risk score.  - Comp Met (CMET) - Lipid panel  3. Decreased GFR Follow kidney function. - Comp Met (CMET)  4. Osteopenia, unspecified location Continue alondrenate. Can go down stairs to get set up for scan. - HM DEXA SCAN   Pcv 20 vaccine today. Can get shingrix thru pharmacy.  Follow up date to be determined after lab review.   Mackie Pai, PA-C

## 2022-07-29 ENCOUNTER — Encounter: Payer: Self-pay | Admitting: Medical

## 2022-08-06 ENCOUNTER — Other Ambulatory Visit: Payer: Self-pay | Admitting: Pulmonary Disease

## 2022-08-06 NOTE — Telephone Encounter (Signed)
Denied refill albuterol MDI.  Patient needs OV.  Message sent to patient via [place].

## 2022-08-13 ENCOUNTER — Telehealth: Payer: Self-pay | Admitting: Medical

## 2022-08-13 NOTE — Telephone Encounter (Signed)
Copied from [place]. Topic: Medicare AWV >> Aug 13, 2022 11:35 AM Devoria Glassing wrote: Reason for CRM: Left message for patient to schedule Annual Wellness Visit(AWV).  Please schedule with Health Nurse Advisor at [facility]. Please call [phone] ask for [facility].

## 2022-08-17 ENCOUNTER — Other Ambulatory Visit: Payer: Self-pay | Admitting: Medical

## 2022-08-21 IMAGING — MG MM DIGITAL SCREENING BILAT W/ TOMO AND CAD
6 of 10 series · 6 of 30 positions shown · non-contrast
Comparison: Previous exam(s).

CLINICAL DATA: Screening.

EXAM:
DIGITAL SCREENING BILATERAL MAMMOGRAM WITH TOMOSYNTHESIS AND CAD
TECHNIQUE: Bilateral screening digital craniocaudal and mediolateral oblique
mammograms were obtained. Bilateral screening digital breast
tomosynthesis was performed. The images were evaluated with
computer-aided detection.

[R CC synth-2D]
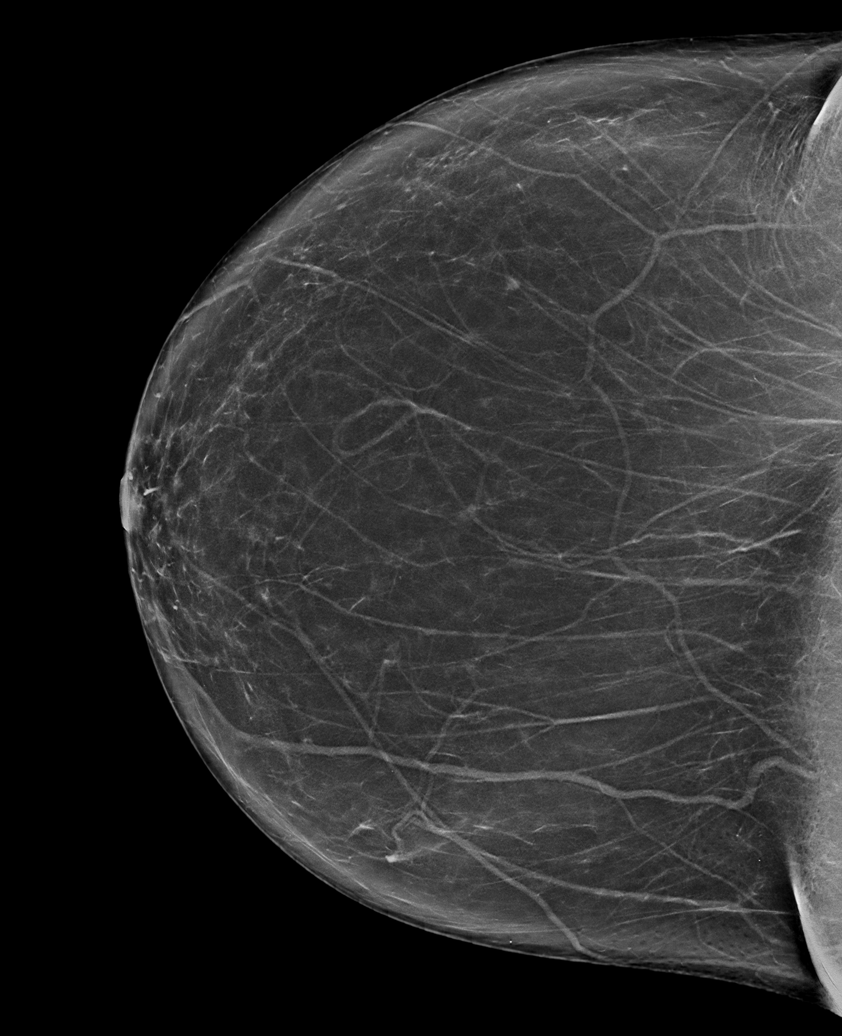

[L MLO synth-2D]
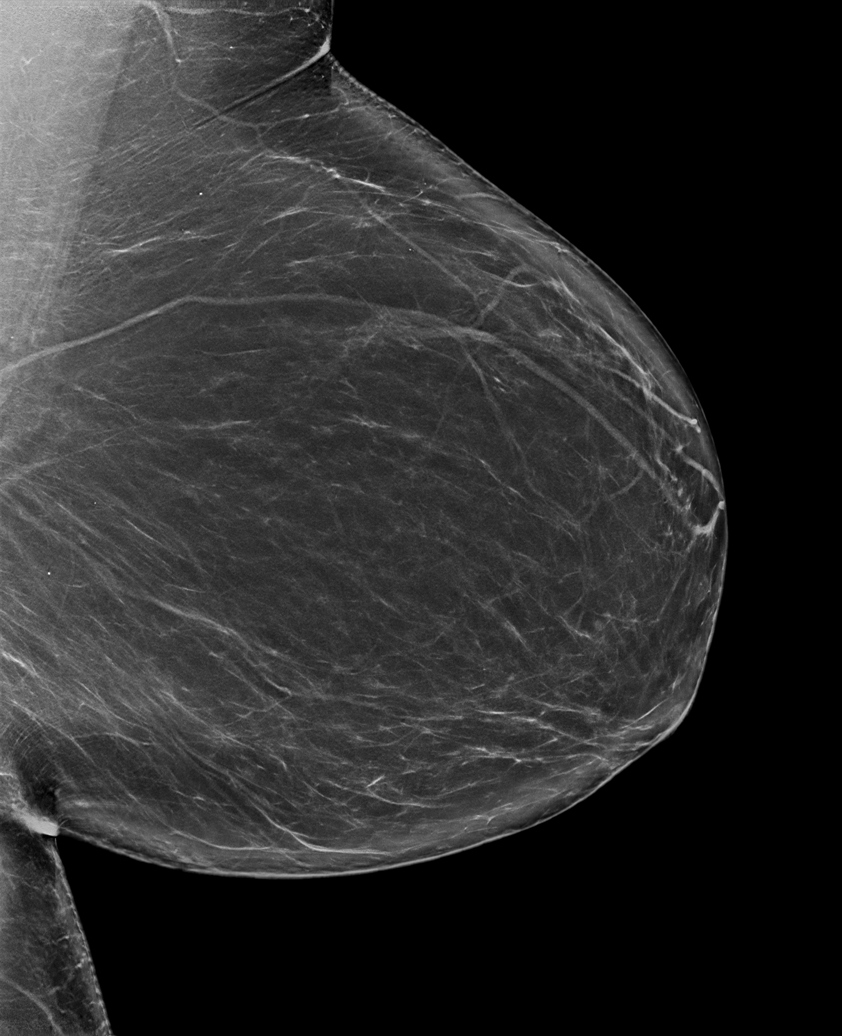

[R MLO synth-2D (1 of 2)]
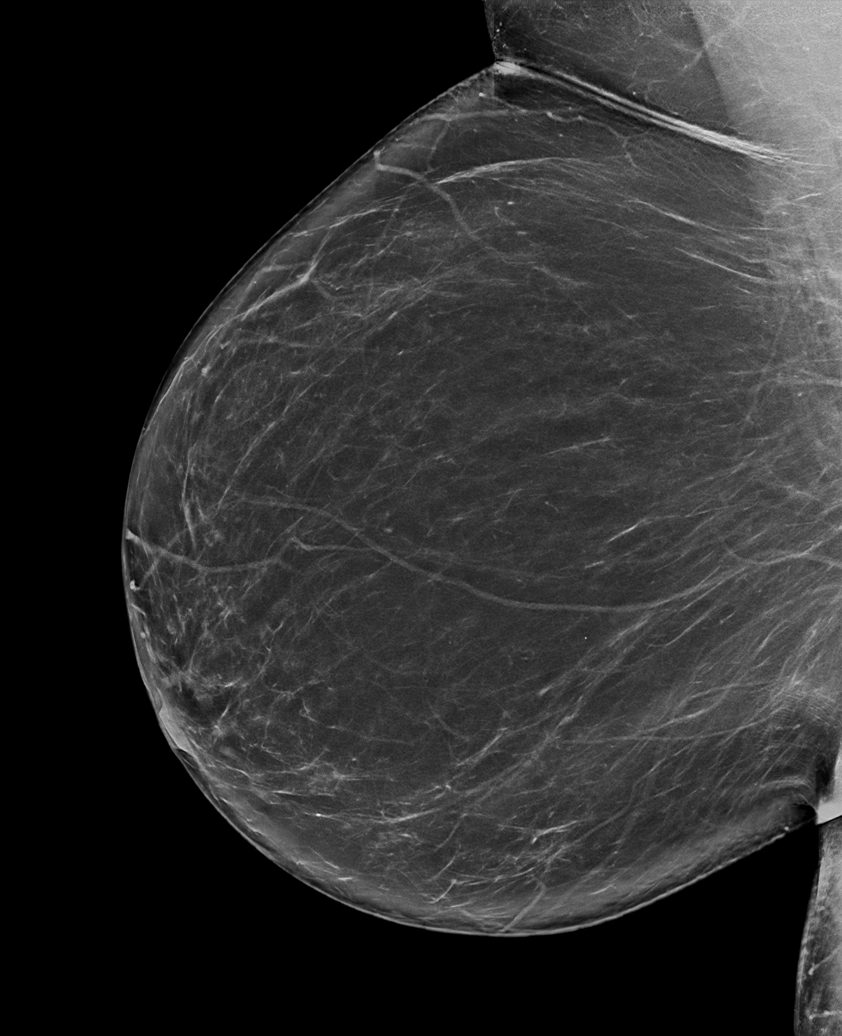

[L CC synth-2D]
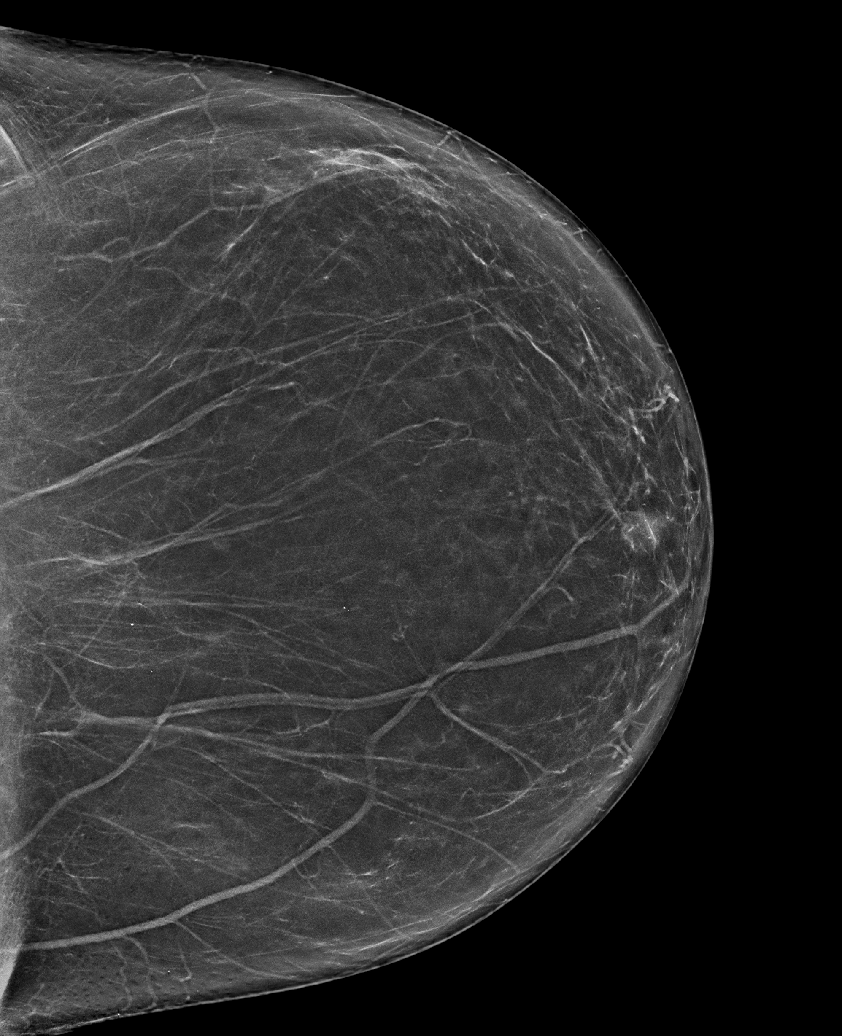

[R MLO synth-2D (2 of 2)]
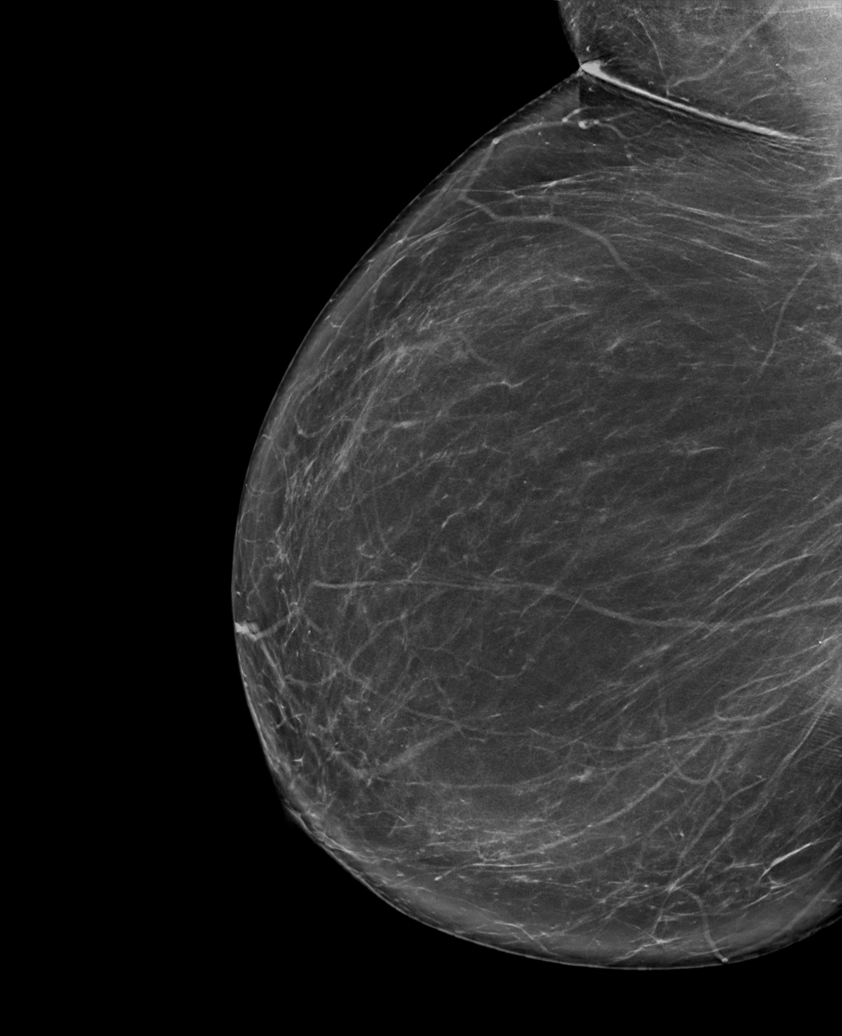

[L MLO tomo · tomo slice 45/90.0]
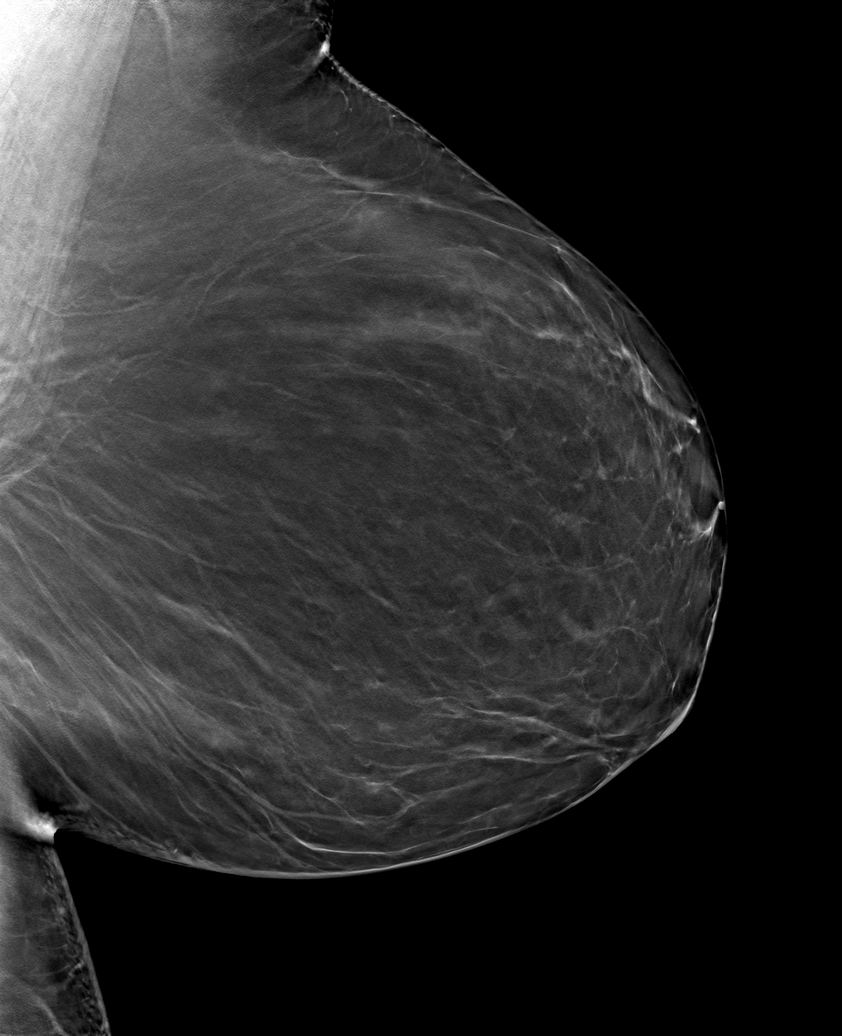

[6 of 30 positions shown; findings below may reference images not displayed]

ACR Breast Density Category b: There are scattered areas of
fibroglandular density.
FINDINGS: There are no findings suspicious for malignancy.
IMPRESSION: No mammographic evidence of malignancy. A result letter of this
screening mammogram will be mailed directly to the patient.

RECOMMENDATION:
Screening mammogram in one year. (Code:51-O-LD2)

BI-RADS CATEGORY  1: Negative.

## 2022-08-24 ENCOUNTER — Other Ambulatory Visit (HOSPITAL_BASED_OUTPATIENT_CLINIC_OR_DEPARTMENT_OTHER): Payer: Self-pay

## 2022-08-25 ENCOUNTER — Telehealth: Payer: Self-pay | Admitting: Medical

## 2022-08-25 ENCOUNTER — Other Ambulatory Visit (HOSPITAL_BASED_OUTPATIENT_CLINIC_OR_DEPARTMENT_OTHER): Payer: Self-pay

## 2022-08-25 NOTE — Telephone Encounter (Signed)
Pt came in office stating that provider had put in an order for Bone Density to be done downstairs imaging (provider informed the pt during her last visit) pt went to imaging and imaging informed pt that the order was wrong and needed to be corrected to a Bone density order. Please put order for Bone Density and call pt when it is ready so pt can get appt to get it done. Please advise. Pt tel [phone].

## 2022-08-25 NOTE — Addendum Note (Signed)
Addended by: Anabel Halon on: 08/25/2022 03:20 PM   Modules accepted: Orders

## 2022-08-25 NOTE — Telephone Encounter (Signed)
Lvm to notifiy pt of order

## 2022-08-27 ENCOUNTER — Ambulatory Visit (HOSPITAL_BASED_OUTPATIENT_CLINIC_OR_DEPARTMENT_OTHER)
Admission: RE | Admit: 2022-08-27 | Discharge: 2022-08-27 | Disposition: A | Discharge status: Home / Self Care | Payer: Medicare Other | Source: Ambulatory Visit | Attending: Medical | Admitting: Medical

## 2022-08-27 ENCOUNTER — Other Ambulatory Visit (HOSPITAL_BASED_OUTPATIENT_CLINIC_OR_DEPARTMENT_OTHER): Payer: Self-pay

## 2022-08-27 DIAGNOSIS — Z87891 Personal history of nicotine dependence: Secondary | ICD-10-CM | POA: Diagnosis not present

## 2022-08-27 DIAGNOSIS — Z8262 Family history of osteoporosis: Secondary | ICD-10-CM | POA: Diagnosis not present

## 2022-08-27 DIAGNOSIS — M8589 Other specified disorders of bone density and structure, multiple sites: Secondary | ICD-10-CM | POA: Insufficient documentation

## 2022-08-27 DIAGNOSIS — D869 Sarcoidosis, unspecified: Secondary | ICD-10-CM | POA: Diagnosis not present

## 2022-08-27 DIAGNOSIS — Z780 Asymptomatic menopausal state: Secondary | ICD-10-CM | POA: Insufficient documentation

## 2022-08-27 DIAGNOSIS — Z13820 Encounter for screening for osteoporosis: Secondary | ICD-10-CM | POA: Insufficient documentation

## 2022-08-27 DIAGNOSIS — M8580 Other specified disorders of bone density and structure, unspecified site: Secondary | ICD-10-CM | POA: Insufficient documentation

## 2022-08-27 MED ORDER — ALENDRONATE SODIUM 70 MG PO TABS
70.0000 mg | ORAL_TABLET | ORAL | 11 refills | Status: DC
  Filled 2022-08-27 – 2022-09-07 (×2): qty 4, 28d supply, fill #0
  Filled 2022-10-09: qty 4, 28d supply, fill #1
  Filled 2022-11-09: qty 4, 28d supply, fill #2
  Filled 2022-12-03: qty 4, 28d supply, fill #3
  Filled 2022-12-31: qty 4, 28d supply, fill #4
  Filled 2023-02-04: qty 4, 28d supply, fill #5
  Filled 2023-03-01: qty 4, 28d supply, fill #6
  Filled 2023-03-27: qty 4, 28d supply, fill #7
  Filled 2023-05-02: qty 4, 28d supply, fill #8
  Filled 2023-05-26: qty 4, 28d supply, fill #9
  Filled 2023-06-23: qty 4, 28d supply, fill #10
  Filled 2023-07-22: qty 4, 28d supply, fill #11

## 2022-08-27 NOTE — Addendum Note (Signed)
Addended by: Anabel Halon on: 08/27/2022 02:03 PM   Modules accepted: Orders

## 2022-08-27 NOTE — Addendum Note (Signed)
Addended by: Anabel Halon on: 08/27/2022 02:01 PM   Modules accepted: Orders

## 2022-09-07 ENCOUNTER — Other Ambulatory Visit (HOSPITAL_BASED_OUTPATIENT_CLINIC_OR_DEPARTMENT_OTHER): Payer: Self-pay

## 2022-09-10 ENCOUNTER — Other Ambulatory Visit (INDEPENDENT_AMBULATORY_CARE_PROVIDER_SITE_OTHER): Payer: Medicare Other

## 2022-09-10 DIAGNOSIS — M8580 Other specified disorders of bone density and structure, unspecified site: Secondary | ICD-10-CM

## 2022-09-10 LAB — VITAMIN D 25 HYDROXY (VIT D DEFICIENCY, FRACTURES): VITD: 88.84 ng/mL (ref 30.00–100.00)

## 2022-09-14 ENCOUNTER — Other Ambulatory Visit (HOSPITAL_BASED_OUTPATIENT_CLINIC_OR_DEPARTMENT_OTHER): Payer: Self-pay

## 2022-09-30 ENCOUNTER — Ambulatory Visit (HOSPITAL_BASED_OUTPATIENT_CLINIC_OR_DEPARTMENT_OTHER): Payer: Medicare Other | Admitting: Pulmonary Disease

## 2022-09-30 ENCOUNTER — Encounter (HOSPITAL_BASED_OUTPATIENT_CLINIC_OR_DEPARTMENT_OTHER): Payer: Self-pay | Admitting: Pulmonary Disease

## 2022-09-30 VITALS — BP 154/90 | HR 62 | Temp 98.1°F | Ht 63.0 in | Wt 224.2 lb

## 2022-09-30 DIAGNOSIS — R7981 Abnormal blood-gas level: Secondary | ICD-10-CM

## 2022-09-30 DIAGNOSIS — D869 Sarcoidosis, unspecified: Secondary | ICD-10-CM

## 2022-09-30 MED ORDER — ALBUTEROL SULFATE HFA 108 (90 BASE) MCG/ACT IN AERS: 2.0000 | INHALATION_SPRAY | Freq: Four times a day (QID) | RESPIRATORY_TRACT | 1 refills | Status: AC | PRN

## 2022-09-30 NOTE — Patient Instructions (Addendum)
Sarcoidosis Reduced DLCO SpO2 in clinic 92% - ORDER pulmonary function tests - ORDER overnight oximetry on room air

## 2022-09-30 NOTE — Progress Notes (Unsigned)
Subjective:   PATIENT ID: Bianca Stephenson GENDER: female DOB: 1955/12/13, MRN: [id number]   HPI  Chief Complaint  Patient presents with   Follow-up    Follow up. Patient has no complaints.     Reason for Visit: Follow-up for sarcoidosis  [name] is a 67 year old with sarcoidosis and hypertension who presents for follow-up.  Synopsis: She was diagnosed skin biopsy 30 years ago. She was referred to Pulmonology in 1998 and had PFTs and underwent bronchoscopy five times. She was on prednisone and bronchodilators for period of time. She reports her symptoms at the time were shortness of breath with rare wheezing during exertion.  2022 - Established care at [facility]. Minimal respiratory symptoms.  04/03/21 Since our last visit she overall is doing well. Reports shortness of breath with exertion and rare wheezing with exertion. Tolerated PFTs. Denies cough, wheezing, leg swelling or chest pain.   09/30/22 Lost to follow-up. Denies shortness of breath, cough or wheezing at baseline. Wheezing only occurs with heavy exertion.  She has used albuterol for wheezing which would occur every few months. No limitations in ADLs. Denies snoring or witnessed apnea.  Social History: Retired Optician, dispensing  Past Medical History:  Diagnosis Date   Hypertension      Allergies  Allergen Reactions   Penicillins Other (See Comments)     Outpatient Medications Prior to Visit  Medication Sig Dispense Refill   albuterol (VENTOLIN HFA) 108 (90 Base) MCG/ACT inhaler Inhale 2 puffs into the lungs every 6 (six) hours as needed for wheezing or shortness of breath. 8 g 2   alendronate (FOSAMAX) 70 MG tablet Take 1 tablet (70 mg total) by mouth every 7 (seven) days. Take with a full glass of water on an empty stomach. 4 tablet 11   chlorthalidone (HYGROTON) 25 MG tablet TAKE 1 TABLET BY MOUTH DAILY 30 tablet 3   labetalol (NORMODYNE) 300 MG tablet Take 1 tablet (300 mg total) by mouth 2 (two) times  daily. 60 tablet 11   diclofenac Sodium (VOLTAREN) 1 % GEL Apply 4 g topically 4 (four) times daily. To affected joint. 100 g 11   lidocaine (LIDODERM) 5 % Place 1 patch onto the skin daily. Remove & Discard patch within 12 hours or as directed by MD 30 patch 0   misoprostol (CYTOTEC) 200 MCG tablet Insert four tablets vaginally the night prior to your appointment 4 tablet 1   No facility-administered medications prior to visit.    Review of Systems  Constitutional:  Negative for chills, diaphoresis, fever, malaise/fatigue and weight loss.  HENT:  Negative for congestion.   Respiratory:  Positive for wheezing. Negative for cough, hemoptysis, sputum production and shortness of breath.   Cardiovascular:  Negative for chest pain, palpitations and leg swelling.     Objective:   Vitals:   09/30/22 1350  BP: (!) 154/90  Pulse: 62  Temp: 98.1 F (36.7 C)  TempSrc: Oral  SpO2: 92%  Weight: 224 lb 3.2 oz (101.7 kg)  Height: 5\' 3"  (1.6 m)   SpO2: 92 % O2 Device: None (Room air)  Physical Exam: General: Well-appearing, no acute distress HENT: [place], AT Eyes: EOMI, no scleral icterus Respiratory: Clear to auscultation bilaterally.  No crackles, wheezing or rales Cardiovascular: RRR, -M/R/G, no JVD Extremities:-Edema,-tenderness Neuro: AAO x4, CNII-XII grossly intact Psych: Normal mood, normal affect  Data Reviewed:  Imaging: CXR 11/01/20 - Chronic interstitial lung changes  PFT: 04/03/21 FVC 2.42 (78%) FEV1 1.83 (  77%) Ratio 73  TLC 88% DLCO 79% Interpretation: No evidence of obstructive or restrictive defect. F-V loops suggest small airway obstruction. Corrected DLCO with minimal reduction   Labs: CBC    Component Value Date/Time   WBC 7.0 07/28/2022 1042   RBC 4.01 07/28/2022 1042   HGB 12.0 07/28/2022 1042   HCT 35.6 (L) 07/28/2022 1042   PLT 265.0 07/28/2022 1042   MCV 89.0 07/28/2022 1042   MCHC 33.7 07/28/2022 1042   RDW 14.7 07/28/2022 1042   LYMPHSABS 1.5  07/28/2022 1042   MONOABS 0.6 07/28/2022 1042   EOSABS 0.1 07/28/2022 1042   BASOSABS 0.0 07/28/2022 1042      Latest Ref Rng & Units 07/28/2022   10:42 AM 08/13/2021   11:36 AM 05/13/2021    1:43 PM  CMP  Glucose 70 - 99 mg/dL 92  101  90   BUN 6 - 23 mg/dL 13  16  16    Creatinine 0.40 - 1.20 mg/dL 0.99  1.11  1.08   Sodium 135 - 145 mEq/L 134  135  137   Potassium 3.5 - 5.1 mEq/L 3.7  3.9  3.7   Chloride 96 - 112 mEq/L 94  95  97   CO2 19 - 32 mEq/L 30  35  32   Calcium 8.4 - 10.5 mg/dL 9.4  9.6  9.7   Total Protein 6.0 - 8.3 g/dL 7.3  7.1  7.0   Total Bilirubin 0.2 - 1.2 mg/dL 0.6  0.5  0.5   Alkaline Phos 39 - 117 U/L 44  45  47   AST 0 - 37 U/L 19  19  19    ALT 0 - 35 U/L 14  13  14        Latest Ref Rng & Units 01/21/2021    2:17 PM  Quantiferon TB Gold  Quantiferon TB Gold Plus NEGATIVE NEGATIVE    EKG 01/21/21 - Sinus bradycardia. Normal PR and Qtc interval.   Assessment & Plan:   Discussion:  67 year old female with sarcoidosis involving skin and lung who presents for follow-up. Lost to follow-up since 2022. Occasional respiratory symptoms requiring SABA. Low-normal O2 sats in-clinic warranting further work-up including PFTs and ONO. Patient previously with reduced DLCO.  We discussed the clinical course of sarcoid and management including serial PFTs, labs, eye exam, and EKG and chest imaging if indicated. If symptoms suggest sarcoid flare in the future, we would manage with steroids +/- biologics.  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  Schedule for PFTs - Recommend ophthalmology  - EKG reviewed. No evidence of conduction abnormalities on 01/2021 EKG - Reviewed labs. No end-organ dysfunction  Shortness of breath - CONTINUE Albuterol AS NEEDED for shortness of breath or wheezing  Reduced DLCO SpO2 in clinic 92% - ORDER pulmonary function tests - ORDER overnight  oximetry on room air  Health Maintenance Immunization History  Administered Date(s) Administered   Influenza-Unspecified 05/01/2021, 05/01/2022   PFIZER(Purple Top)SARS-COV-2 Vaccination 09/21/2019, 10/13/2019, 06/05/2020   PNEUMOCOCCAL CONJUGATE-20 07/28/2022   Respiratory Syncytial Virus Vaccine,Recomb Aduvanted(Arexvy) 05/01/2022   Tdap 05/13/2021   CT Lung Screen - not indicated  Orders Placed This Encounter  Procedures   Ambulatory referral to Ophthalmology    Referral Priority:   Routine    Referral Type:   Consultation    Referral Reason:   Specialty Services Required    Requested Specialty:  Ophthalmology    Number of Visits Requested:   1   Pulse oximetry, overnight    On room air    Standing Status:   Future    Standing Expiration Date:   09/30/2023   Pulmonary function test    Standing Status:   Future    Standing Expiration Date:   09/30/2023    Order Specific Question:   Where should this test be performed?    Answer:   [facility]    Order Specific Question:   Full PFT: includes the following: basic spirometry, spirometry pre & post bronchodilator, diffusion capacity (DLCO), lung volumes    Answer:   Full PFT   Meds ordered this encounter  Medications   albuterol (VENTOLIN HFA) 108 (90 Base) MCG/ACT inhaler    Sig: Inhale 2 puffs into the lungs every 6 (six) hours as needed for wheezing or shortness of breath.    Dispense:  8 g    Refill:  1   Return for with Dr. Loanne Drilling, after PFT when next available.  I have spent a total time of 33-minutes on the day of the appointment including chart review, data review, collecting history, coordinating care and discussing medical diagnosis and plan with the patient/family. Past medical history, allergies, medications were reviewed. Pertinent imaging, labs and tests included in this note have been reviewed and interpreted independently by me.   [place], MD [facility] 09/30/2022 2:16 PM   Office Number [phone]

## 2022-10-01 ENCOUNTER — Encounter (HOSPITAL_BASED_OUTPATIENT_CLINIC_OR_DEPARTMENT_OTHER): Payer: Self-pay | Admitting: Pulmonary Disease

## 2022-10-09 ENCOUNTER — Ambulatory Visit (INDEPENDENT_AMBULATORY_CARE_PROVIDER_SITE_OTHER): Payer: Medicare Other | Admitting: Pulmonary Disease

## 2022-10-09 DIAGNOSIS — D869 Sarcoidosis, unspecified: Secondary | ICD-10-CM | POA: Diagnosis not present

## 2022-10-09 LAB — PULMONARY FUNCTION TEST
DL/VA % pred: 108 %
DL/VA: 4.54 ml/min/mmHg/L
DLCO cor % pred: 88 %
DLCO cor: 16.9 ml/min/mmHg
DLCO unc % pred: 84 %
DLCO unc: 16.13 ml/min/mmHg
FEF 25-75 Post: 1.76 L/sec
FEF 25-75 Pre: 1.33 L/sec
FEF2575-%Change-Post: 31 %
FEF2575-%Pred-Post: 86 %
FEF2575-%Pred-Pre: 65 %
FEV1-%Change-Post: 6 %
FEV1-%Pred-Post: 77 %
FEV1-%Pred-Pre: 72 %
FEV1-Post: 1.78 L
FEV1-Pre: 1.67 L
FEV1FVC-%Change-Post: 0 %
FEV1FVC-%Pred-Pre: 99 %
FEV6-%Change-Post: 6 %
FEV6-%Pred-Post: 80 %
FEV6-%Pred-Pre: 75 %
FEV6-Post: 2.32 L
FEV6-Pre: 2.18 L
FEV6FVC-%Pred-Post: 104 %
FEV6FVC-%Pred-Pre: 104 %
FVC-%Change-Post: 6 %
FVC-%Pred-Post: 77 %
FVC-%Pred-Pre: 72 %
FVC-Post: 2.32 L
FVC-Pre: 2.18 L
Post FEV1/FVC ratio: 77 %
Post FEV6/FVC ratio: 100 %
Pre FEV1/FVC ratio: 76 %
Pre FEV6/FVC Ratio: 100 %
RV % pred: 81 %
RV: 1.68 L
TLC % pred: 84 %
TLC: 4.15 L

## 2022-10-09 NOTE — Progress Notes (Signed)
Full PFT Performed Today  

## 2022-10-09 NOTE — Patient Instructions (Signed)
Full PFT Performed Today  

## 2022-10-12 DIAGNOSIS — D860 Sarcoidosis of lung: Secondary | ICD-10-CM | POA: Diagnosis not present

## 2022-10-14 ENCOUNTER — Telehealth: Payer: Self-pay | Admitting: Medical

## 2022-10-14 NOTE — Telephone Encounter (Signed)
Copied from CRM [id number]. Topic: Medicare AWV >> Oct 14, 2022  2:56 PM Payton Doughty wrote: Reason for CRM: Called patient to schedule Medicare Annual Wellness Visit (AWV). Left message for patient to call back and schedule Medicare Annual Wellness Visit (AWV).  Last date of AWV: NONE  Please schedule an appointment at any time with Donne Anon, CMA  .  If any questions, please contact me.  Thank you ,  Verlee Rossetti; Care Guide Ambulatory Clinical Support [place] [facility] Direct Dial: [phone]

## 2022-10-16 ENCOUNTER — Other Ambulatory Visit (HOSPITAL_BASED_OUTPATIENT_CLINIC_OR_DEPARTMENT_OTHER): Payer: Self-pay

## 2022-10-16 MED ORDER — COMIRNATY 30 MCG/0.3ML IM SUSY
0.3000 mL | PREFILLED_SYRINGE | INTRAMUSCULAR | 0 refills | Status: DC
  Filled 2022-10-16: qty 0.3, 1d supply, fill #0

## 2022-10-19 ENCOUNTER — Encounter: Payer: Self-pay | Admitting: *Deleted

## 2022-10-20 NOTE — Progress Notes (Signed)
[facility] - Clinic Note  11/03/2022   CHIEF COMPLAINT Patient presents for Retina Evaluation  HISTORY OF PRESENT ILLNESS: Bianca Stephenson is a 67 y.o. female who presents to the clinic today for:  HPI     Retina Evaluation   In both eyes.  This started 2 weeks ago.  Duration of 2 weeks.  Context:  near vision.  Response to treatment was no improvement.  I, the attending physician,  performed the HPI with the patient and updated documentation appropriately.        Comments   Ret eval per Dr Sarcoidosis per Dr Everardo All pt is reporting that she has noticed that she has little trouble with seeing at near she is currently using otc readers and that does she denies any flashes or floaters       Last edited by Rennis Chris, MD on 11/03/2022  4:03 PM.    Pt is here on the referral of Dr. Everardo All, her pulmonologist, for sarcoidosis, pt states she is not having any eye problems, she is not currently on any medications for sarcoid, when she was in her 34's, she was on prednisone for 10 years, she has never been on methotrexate, pt is not on any medication for her lungs, she states she gets very tired, if she is doing house work, she has to stop and rest, pt states last time she saw on eye dr, she was told she has scarring in the back of her eye   Referring physician: Luciano Cutter, MD [address] [place],  [place] [address]  HISTORICAL INFORMATION:  Selected notes from the MEDICAL RECORD NUMBER Referred by Dr. Everardo All for sarcoidosis LEE:  Ocular Hx- PMH-   CURRENT MEDICATIONS: No current outpatient medications on file. (Ophthalmic Drugs)   No current facility-administered medications for this visit. (Ophthalmic Drugs)   Current Outpatient Medications (Other)  Medication Sig   albuterol (VENTOLIN HFA) 108 (90 Base) MCG/ACT inhaler Inhale 2 puffs into the lungs every 6 (six) hours as needed for wheezing or shortness of breath.   alendronate  (FOSAMAX) 70 MG tablet Take 1 tablet (70 mg total) by mouth every 7 (seven) days. Take with a full glass of water on an empty stomach.   chlorthalidone (HYGROTON) 25 MG tablet TAKE 1 TABLET BY MOUTH DAILY   fluticasone-salmeterol (ADVAIR DISKUS) 100-50 MCG/ACT AEPB Inhale 1 puff into the lungs 2 (two) times daily.   labetalol (NORMODYNE) 300 MG tablet Take 1 tablet (300 mg total) by mouth 2 (two) times daily.   No current facility-administered medications for this visit. (Other)   REVIEW OF SYSTEMS: ROS   Positive for: Cardiovascular, Eyes, Allergic/Imm Last edited by Etheleen Mayhew, COT on 11/03/2022  2:08 PM.     ALLERGIES Allergies  Allergen Reactions   Penicillins Other (See Comments)   PAST MEDICAL HISTORY Past Medical History:  Diagnosis Date   Hypertension    History reviewed. No pertinent surgical history. FAMILY HISTORY Family History  Problem Relation Age of Onset   Hypertension Mother    Alzheimer's disease Mother    SOCIAL HISTORY Social History   Tobacco Use   Smoking status: Former    Packs/day: 1.00    Years: 10.00    Additional pack years: 0.00    Total pack years: 10.00    Types: Cigarettes    Start date: 81    Quit date: 11/01/1996    Years since quitting: 26.0   Smokeless  tobacco: Never  Substance Use Topics   Alcohol use: Never   Drug use: Never       OPHTHALMIC EXAM:  Base Eye Exam     Visual Acuity (Snellen - Linear)       Right Left   Dist cc 20/20 -1 20/20    Correction: Glasses         Tonometry (Tonopen, 2:16 PM)       Right Left   Pressure 14 14         Pupils       Pupils Dark Light Shape React APD   Right PERRL 3 2 Round Brisk None   Left PERRL 3 2 Round Brisk None         Visual Fields       Left Right    Full Full         Extraocular Movement       Right Left    Full Full         Dilation     Both eyes: 2.5% Phenylephrine @ 2:16 PM           Slit Lamp and Fundus Exam      Slit Lamp Exam       Right Left   Lids/Lashes Dermatochalasis - upper lid Dermatochalasis - upper lid   Conjunctiva/Sclera White and quiet White and quiet   Cornea Trace tear film debris Trace tear film debris, 2+ inferior Punctate epithelial erosions   Anterior Chamber deep and clear deep and clear   Iris Round and dilated Round and dilated   Lens 2+ Nuclear sclerosis, 2+ Cortical cataract 2+ Nuclear sclerosis with mild brunescence, 2+ Cortical cataract   Anterior Vitreous mild syneresis mild syneresis         Fundus Exam       Right Left   Disc Pink and Sharp Pink and Sharp   C/D Ratio 0.2 0.3   Macula Flat, Blunted foveal reflex, Drusen, RPE mottling and clumping, No heme or edema Flat, Blunted foveal reflex, Drusen, RPE mottling, clumping and early atropy, No heme or edema   Vessels attenuated, Tortuous, no sheathing attenuated, Tortuous, no sheathing   Periphery Attached, reticular degeneration, No heme Attached, reticular degeneration, No heme, peripheral cystoid degeneration, scattered, punctate pigmented CR scars           Refraction     Wearing Rx       Sphere Cylinder Axis   Right -1.25 +1.25 021   Left -1.50 +1.75 160           IMAGING AND PROCEDURES  Imaging and Procedures for 11/03/2022  OCT, Retina - OU - Both Eyes       Right Eye Quality was good. Central Foveal Thickness: 255. Progression has no prior data. Findings include normal foveal contour, no IRF, no SRF, retinal drusen , vitreomacular adhesion .   Left Eye Quality was good. Central Foveal Thickness: 276. Progression has no prior data. Findings include normal foveal contour, no IRF, no SRF, retinal drusen , epiretinal membrane, vitreomacular adhesion .   Notes *Images captured and stored on drive  Diagnosis / Impression:  NFP, no IRF/SRF OU +drusen OU  Clinical management:  See below  Abbreviations: NFP - Normal foveal profile. CME - cystoid macular edema. PED - pigment epithelial  detachment. IRF - intraretinal fluid. SRF - subretinal fluid. EZ - ellipsoid zone. ERM - epiretinal membrane. ORA - outer retinal atrophy. ORT - outer retinal tubulation. SRHM -  subretinal hyper-reflective material. IRHM - intraretinal hyper-reflective material      Fluorescein Angiography Optos (Transit OS)       Right Eye Progression has no prior data. Early phase findings include staining (Punctate peripheral staining). Mid/Late phase findings include staining (Mild vascular non-perfusion far periphery; scattered peripheral staining -- mild; no leakage).   Left Eye Progression has no prior data. Early phase findings include delayed filling, staining (Punctate peripheral staining). Mid/Late phase findings include leakage, staining, vascular perfusion defect (Mild vascular non perfusion far periphery; focal perivascular leakage temporal periphery; scattered peripheral staining -- mild; mild late perifoveal staining nasal macula).   Notes **Images stored on drive**  Impression: OD: Mild vascular non-perfusion far periphery; scattered peripheral staining -- mild; no leakage OS: Mild vascular non perfusion far periphery; focal perivascular leakage temporal periphery; scattered peripheral staining -- mild; mild late perifoveal staining nasal macula           ASSESSMENT/PLAN:   ICD-10-CM   1. Sarcoidosis  D86.9     2. Intermediate stage nonexudative age-related macular degeneration of both eyes  H35.3132     3. Essential hypertension  I10     4. Hypertensive retinopathy of both eyes  H35.033 OCT, Retina - OU - Both Eyes    Fluorescein Angiography Optos (Transit OS)    5. Combined forms of age-related cataract of both eyes  H25.813      Sarcoidosis - diagnosed by skin biopsy during age 73s - history of breathing issues and previously on prednisone and bronchodilators - denies any history of any ophthalmic or vision issues - exam shows no significant active inflammation in the  eyes  OU, but OS with punctate pigmented chorioretinal scars that could represent focal episodes of inflammation in the past - FA 04.30.24 with mild focal perivascular leakage in far temporal periphery OS -- minimal concern for active ocular inflammation OU - no retinal or ophthalmic interventions indicated or recommended   - will monitor  - f/u 6 months, DFE, OCT, FA (transit OS)  2. Age related macular degeneration, non-exudative, both eyes  - The incidence, anatomy, and pathology of dry AMD, risk of progression, and the AREDS and AREDS 2 study including smoking risks discussed with patient.  - Recommend amsler grid monitoring  - f/u 6 months, DFE, OCT  3,4. Hypertensive retinopathy OU - discussed importance of tight BP control - monitor  5. Mixed Cataract OU - The symptoms of cataract, surgical options, and treatments and risks were discussed with patient. - discussed diagnosis and progression - monitor     Ophthalmic Meds Ordered this visit:  No orders of the defined types were placed in this encounter.    Return in about 6 months (around 05/05/2023) for f/u sarcoidosis OU, DFE, OCT, FA (transit OS).  There are no Patient Instructions on file for this visit.  Explained the diagnoses, plan, and follow up with the patient and they expressed understanding.  Patient expressed understanding of the importance of proper follow up care.   This document serves as a record of services personally performed by Karie Chimera, MD, PhD. It was created on their behalf by Gerilyn Nestle, COT an ophthalmic technician. The creation of this record is the provider's dictation and/or activities during the visit.    Electronically signed by:  Gerilyn Nestle, COT  04.16.24 4:11 PM  This document serves as a record of services personally performed by Karie Chimera, MD, PhD. It was created on their behalf by Glee Arvin. Manson Passey,  OA an ophthalmic technician. The creation of this record is the  provider's dictation and/or activities during the visit.    Electronically signed by: Glee Arvin. [place], [place] 04.30.2024 4:11 PM  Karie Chimera, M.D., Ph.D. Diseases & Surgery of the Retina and Vitreous [facility] 11/03/2022  I have reviewed the above documentation for accuracy and completeness, and I agree with the above. Karie Chimera, M.D., Ph.D. 11/03/22 4:16 PM   Abbreviations: M myopia (nearsighted); A astigmatism; H hyperopia (farsighted); P presbyopia; Mrx spectacle prescription;  CTL contact lenses; OD right eye; OS left eye; OU both eyes  XT exotropia; ET esotropia; PEK punctate epithelial keratitis; PEE punctate epithelial erosions; DES dry eye syndrome; MGD meibomian gland dysfunction; ATs artificial tears; PFAT's preservative free artificial tears; NSC nuclear sclerotic cataract; PSC posterior subcapsular cataract; ERM epi-retinal membrane; PVD posterior vitreous detachment; RD retinal detachment; DM diabetes mellitus; DR diabetic retinopathy; NPDR non-proliferative diabetic retinopathy; PDR proliferative diabetic retinopathy; CSME clinically significant macular edema; DME diabetic macular edema; dbh dot blot hemorrhages; CWS cotton wool spot; POAG primary open angle glaucoma; C/D cup-to-disc ratio; HVF humphrey visual field; GVF goldmann visual field; OCT optical coherence tomography; IOP intraocular pressure; BRVO Branch retinal vein occlusion; CRVO central retinal vein occlusion; CRAO central retinal artery occlusion; BRAO branch retinal artery occlusion; RT retinal tear; SB scleral buckle; PPV pars plana vitrectomy; VH Vitreous hemorrhage; PRP panretinal laser photocoagulation; IVK intravitreal kenalog; VMT vitreomacular traction; MH Macular hole;  NVD neovascularization of the disc; NVE neovascularization elsewhere; AREDS age related eye disease study; ARMD age related macular degeneration; POAG primary open angle glaucoma; EBMD epithelial/anterior basement  membrane dystrophy; ACIOL anterior chamber intraocular lens; IOL intraocular lens; PCIOL posterior chamber intraocular lens; Phaco/IOL phacoemulsification with intraocular lens placement; PRK photorefractive keratectomy; LASIK laser assisted in situ keratomileusis; HTN hypertension; DM diabetes mellitus; COPD chronic obstructive pulmonary disease

## 2022-10-22 ENCOUNTER — Ambulatory Visit (HOSPITAL_BASED_OUTPATIENT_CLINIC_OR_DEPARTMENT_OTHER): Payer: Medicare Other | Admitting: Pulmonary Disease

## 2022-10-22 ENCOUNTER — Encounter (HOSPITAL_BASED_OUTPATIENT_CLINIC_OR_DEPARTMENT_OTHER): Payer: Self-pay | Admitting: Pulmonary Disease

## 2022-10-22 ENCOUNTER — Other Ambulatory Visit (HOSPITAL_COMMUNITY): Payer: Self-pay

## 2022-10-22 VITALS — BP 140/70 | HR 64 | Ht 63.0 in | Wt 223.4 lb

## 2022-10-22 DIAGNOSIS — D869 Sarcoidosis, unspecified: Secondary | ICD-10-CM | POA: Diagnosis not present

## 2022-10-22 DIAGNOSIS — J42 Unspecified chronic bronchitis: Secondary | ICD-10-CM

## 2022-10-22 DIAGNOSIS — G4734 Idiopathic sleep related nonobstructive alveolar hypoventilation: Secondary | ICD-10-CM | POA: Diagnosis not present

## 2022-10-22 MED ORDER — FLUTICASONE-SALMETEROL 100-50 MCG/ACT IN AEPB: 1.0000 | INHALATION_SPRAY | Freq: Two times a day (BID) | RESPIRATORY_TRACT | 2 refills | Status: DC

## 2022-10-22 NOTE — Progress Notes (Signed)
Subjective:   PATIENT ID: Bianca Stephenson GENDER: female DOB: 06/30/1956, MRN: [id number]   HPI  Chief Complaint  Patient presents with   Follow-up    Breathing has been fine    Reason for Visit: Follow-up for sarcoidosis  Bianca Stephenson is a 67 year old with sarcoidosis and hypertension who presents for follow-up.  Synopsis: She was diagnosed skin biopsy 30 years ago. She was referred to Pulmonology in 1998 and had PFTs and underwent bronchoscopy five times. She was on prednisone and bronchodilators for period of time. She reports her symptoms at the time were shortness of breath with rare wheezing during exertion.  2022 - Established care at [facility]. Minimal respiratory symptoms.  04/03/21 Since our last visit she overall is doing well. Reports shortness of breath with exertion and rare wheezing with exertion. Tolerated PFTs. Denies cough, wheezing, leg swelling or chest pain.   09/30/22 Lost to follow-up. Denies shortness of breath, cough or wheezing at baseline. Wheezing only occurs with heavy exertion.  She has used albuterol for wheezing which would occur every few months. No limitations in ADLs. Denies snoring or witnessed apnea.  10/22/22 She continues to have wheezing occasionally with exertion. Uses albuterol. Denies shortness of breath or cough at baseline. No sleeping issues or night time awakening or snoring.   Social History: Retired [employer] features editor  Past Medical History:  Diagnosis Date   Hypertension      Allergies  Allergen Reactions   Penicillins Other (See Comments)     Outpatient Medications Prior to Visit  Medication Sig Dispense Refill   albuterol (VENTOLIN HFA) 108 (90 Base) MCG/ACT inhaler Inhale 2 puffs into the lungs every 6 (six) hours as needed for wheezing or shortness of breath. 8 g 1   alendronate (FOSAMAX) 70 MG tablet Take 1 tablet (70 mg total) by mouth every 7 (seven) days. Take with a full glass of water on an empty stomach. 4 tablet  11   chlorthalidone (HYGROTON) 25 MG tablet TAKE 1 TABLET BY MOUTH DAILY 30 tablet 3   labetalol (NORMODYNE) 300 MG tablet Take 1 tablet (300 mg total) by mouth 2 (two) times daily. 60 tablet 11   COVID-19 mRNA vaccine 2023-2024 (COMIRNATY) syringe Inject 0.3 mLs into the muscle. (Patient not taking: Reported on 10/22/2022) 0.3 mL 0   No facility-administered medications prior to visit.    Review of Systems  Constitutional:  Negative for chills, diaphoresis, fever, malaise/fatigue and weight loss.  HENT:  Negative for congestion.   Respiratory:  Positive for wheezing. Negative for cough, hemoptysis, sputum production and shortness of breath.   Cardiovascular:  Negative for chest pain, palpitations and leg swelling.     Objective:   Vitals:   10/22/22 1426  BP: (!) 140/70  Pulse: 64  SpO2: 94%  Weight: 223 lb 6.4 oz (101.3 kg)  Height:  (1.6 m)   SpO2: 94 % O2 Device: None (Room air)  Physical Exam: General: Well-appearing, no acute distress HENT: [place], AT Eyes: EOMI, no scleral icterus Respiratory: Clear to auscultation bilaterally.  No crackles, wheezing or rales Cardiovascular: RRR, -M/R/G, no JVD Extremities:-Edema,-tenderness Neuro: AAO x4, CNII-XII grossly intact Psych: Normal mood, normal affect  Data Reviewed:  Imaging: CXR 11/01/20 - Chronic interstitial lung changes  PFT: 04/03/21 FVC 2.42 (78%) FEV1 1.83 (77%) Ratio 73  TLC 88% DLCO 79% Interpretation: No evidence of obstructive or restrictive defect. F-V loops suggest small airway obstruction. Corrected DLCO with minimal reduction  10/09/22  FVC 2.32 (77%) FEV1 1.78 (77%) Ratio 77  TLC 84% DLCO 84% Interpretation: No obstructive or restrictive defect. Normal DLCO  Labs: CBC    Component Value Date/Time   WBC 7.0 07/28/2022 1042   RBC 4.01 07/28/2022 1042   HGB 12.0 07/28/2022 1042   HCT 35.6 (L) 07/28/2022 1042   PLT 265.0 07/28/2022 1042   MCV 89.0 07/28/2022 1042   MCHC 33.7 07/28/2022 1042    RDW 14.7 07/28/2022 1042   LYMPHSABS 1.5 07/28/2022 1042   MONOABS 0.6 07/28/2022 1042   EOSABS 0.1 07/28/2022 1042   BASOSABS 0.0 07/28/2022 1042      Latest Ref Rng & Units 07/28/2022   10:42 AM 08/13/2021   11:36 AM 05/13/2021    1:43 PM  CMP  Glucose 70 - 99 mg/dL 92  914  90   BUN 6 - 23 mg/dL 13  16  16    Creatinine 0.40 - 1.20 mg/dL 7.82  9.56  2.13   Sodium 135 - 145 mEq/L 134  135  137   Potassium 3.5 - 5.1 mEq/L 3.7  3.9  3.7   Chloride 96 - 112 mEq/L 94  95  97   CO2 19 - 32 mEq/L 30  35  32   Calcium 8.4 - 10.5 mg/dL 9.4  9.6  9.7   Total Protein 6.0 - 8.3 g/dL 7.3  7.1  7.0   Total Bilirubin 0.2 - 1.2 mg/dL 0.6  0.5  0.5   Alkaline Phos 39 - 117 U/L 44  45  47   AST 0 - 37 U/L 19  19  19    ALT 0 - 35 U/L 14  13  14        Latest Ref Rng & Units 01/21/2021    2:17 PM  Quantiferon TB Gold  Quantiferon TB Gold Plus NEGATIVE NEGATIVE    EKG 01/21/21 - Sinus bradycardia. Normal PR and Qtc interval.   ONO SpO2 < 88% 41 min 44 sec Nadir SpO2 64% Interpretation: Qualifies for oxygen  Assessment & Plan:   Discussion: 67 year old female with sarcoidosis involving skin and lung, HTN and nocturnal hypoxemia who presents for follow-up. Lost to follow-up after 2022. Last office visit with low-normal sats and ONO confirmed nocturnal hypoxemia. Differential includes OSA vs chronic lung disease. If sleep study negative will need chest imaging. PFTs reviewed and normal however may benefit from trial of ICS/LABA for wheezing.  We discussed the clinical course of sarcoid and management including serial PFTs, labs, eye exam, and EKG and chest imaging if indicated. If symptoms suggest sarcoid flare in the future, we would manage with steroids +/- biologics.  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  Last 10/09/22.  - Scheduled for ophthalmology to rule out sarcoid. Hx scarring  -  EKG reviewed. No evidence of conduction abnormalities on 01/2021 EKG - Reviewed labs. No end-organ dysfunction  Chronic bronchitis with wheezing PFTs normal.  - START Advair Diskus 100-50 mcg ONE puff in the morning and evening. Rinse mouth out after use - CONTINUE Albuterol AS NEEDED for shortness of breath or wheezing  Nocturnal hypoxemia Suspect OSA. STOP BANG 4 - intermediate --ORDER home sleep test --ORDER 2L oxygen at night  Health Maintenance Immunization History  Administered Date(s) Administered   COVID-19, mRNA, vaccine(Comirnaty)12 years and older 10/16/2022   Influenza-Unspecified 05/01/2021, 05/01/2022   PFIZER(Purple Top)SARS-COV-2 Vaccination 09/21/2019, 10/13/2019, 06/05/2020  PNEUMOCOCCAL CONJUGATE-20 07/28/2022   Respiratory Syncytial Virus Vaccine,Recomb Aduvanted(Arexvy) 05/01/2022   Tdap 05/13/2021   CT Lung Screen - not indicated  Orders Placed This Encounter  Procedures   Ambulatory Referral for DME    Referral Priority:   Routine    Referral Type:   Durable Medical Equipment Purchase    Number of Visits Requested:   1   Home sleep test    Standing Status:   Future    Standing Expiration Date:   10/22/2023    Order Specific Question:   Where should this test be performed:    Answer:   [facility] - Pul[facility]   Meds ordered this encounter  Medications   fluticasone-salmeterol (ADVAIR DISKUS) 100-50 MCG/ACT AEPB    Sig: Inhale 1 puff into the lungs 2 (two) times daily.    Dispense:  60 each    Refill:  2   Return in about 3 months (around 01/21/2023).  I have spent a total time of 33-minutes on the day of the appointment including chart review, data review, collecting history, coordinating care and discussing medical diagnosis and plan with the patient/family. Past medical history, allergies, medications were reviewed. Pertinent imaging, labs and tests included in this note have been reviewed and interpreted independently by me.   Jowana Thumma Mechele Collin,  MD [facility] 10/22/2022 2:27 PM  Office Number [phone]

## 2022-10-22 NOTE — Patient Instructions (Addendum)
Nocturnal hypoxemia Suspect OSA --ORDER home sleep test --ORDER 2L oxygen at night  Chronic bronchitis with wheezing PFTs normal.  - START Advair Diskus 100-50 mcg ONE puff in the morning and evening. Rinse mouth out after use - CONTINUE Albuterol AS NEEDED for shortness of breath or wheezing  Pulmonary sarcoidosis with prior skin involvement - Dx in 1988 via skin biopsy - No indication for prednisone therapy - Annual PFTs.  Reviewed 10/09/22 and normal - Scheduled for ophthalmology to rule out sarcoid. Hx scarring

## 2022-10-23 DIAGNOSIS — G4736 Sleep related hypoventilation in conditions classified elsewhere: Secondary | ICD-10-CM | POA: Diagnosis not present

## 2022-10-23 DIAGNOSIS — G4733 Obstructive sleep apnea (adult) (pediatric): Secondary | ICD-10-CM | POA: Diagnosis not present

## 2022-10-24 ENCOUNTER — Encounter (HOSPITAL_BASED_OUTPATIENT_CLINIC_OR_DEPARTMENT_OTHER): Payer: Self-pay | Admitting: Pulmonary Disease

## 2022-10-30 ENCOUNTER — Encounter: Payer: Self-pay | Admitting: Pulmonary Disease

## 2022-11-03 ENCOUNTER — Encounter (INDEPENDENT_AMBULATORY_CARE_PROVIDER_SITE_OTHER): Payer: Self-pay | Admitting: Ophthalmology

## 2022-11-03 ENCOUNTER — Ambulatory Visit (INDEPENDENT_AMBULATORY_CARE_PROVIDER_SITE_OTHER): Payer: Medicare Other | Admitting: Ophthalmology

## 2022-11-03 DIAGNOSIS — I10 Essential (primary) hypertension: Secondary | ICD-10-CM

## 2022-11-03 DIAGNOSIS — H353132 Nonexudative age-related macular degeneration, bilateral, intermediate dry stage: Secondary | ICD-10-CM

## 2022-11-03 DIAGNOSIS — H25813 Combined forms of age-related cataract, bilateral: Secondary | ICD-10-CM

## 2022-11-03 DIAGNOSIS — H35033 Hypertensive retinopathy, bilateral: Secondary | ICD-10-CM | POA: Diagnosis not present

## 2022-11-03 DIAGNOSIS — D869 Sarcoidosis, unspecified: Secondary | ICD-10-CM

## 2022-11-07 DIAGNOSIS — G4730 Sleep apnea, unspecified: Secondary | ICD-10-CM | POA: Diagnosis not present

## 2022-11-11 ENCOUNTER — Other Ambulatory Visit: Payer: Self-pay | Admitting: Medical

## 2022-11-22 DIAGNOSIS — G4733 Obstructive sleep apnea (adult) (pediatric): Secondary | ICD-10-CM | POA: Diagnosis not present

## 2022-11-22 DIAGNOSIS — G4736 Sleep related hypoventilation in conditions classified elsewhere: Secondary | ICD-10-CM | POA: Diagnosis not present

## 2022-11-24 ENCOUNTER — Encounter (INDEPENDENT_AMBULATORY_CARE_PROVIDER_SITE_OTHER): Payer: Medicare Other

## 2022-11-24 DIAGNOSIS — G4733 Obstructive sleep apnea (adult) (pediatric): Secondary | ICD-10-CM

## 2022-11-24 DIAGNOSIS — G4734 Idiopathic sleep related nonobstructive alveolar hypoventilation: Secondary | ICD-10-CM

## 2022-12-06 ENCOUNTER — Encounter (HOSPITAL_BASED_OUTPATIENT_CLINIC_OR_DEPARTMENT_OTHER): Payer: Self-pay | Admitting: Pulmonary Disease

## 2022-12-06 DIAGNOSIS — G4734 Idiopathic sleep related nonobstructive alveolar hypoventilation: Secondary | ICD-10-CM

## 2022-12-06 DIAGNOSIS — G4733 Obstructive sleep apnea (adult) (pediatric): Secondary | ICD-10-CM

## 2022-12-07 NOTE — Telephone Encounter (Signed)
Please order auto CPAP 5-20 cmH20 with supplies

## 2022-12-08 NOTE — Telephone Encounter (Signed)
Order placed for cpap start. Nothing further needed.

## 2022-12-12 ENCOUNTER — Other Ambulatory Visit: Payer: Self-pay | Admitting: Medical

## 2022-12-14 DIAGNOSIS — G4733 Obstructive sleep apnea (adult) (pediatric): Secondary | ICD-10-CM | POA: Diagnosis not present

## 2022-12-16 NOTE — Addendum Note (Signed)
Addended by: Arlyss Repress on: 12/16/2022 01:02 PM   Modules accepted: Orders

## 2022-12-23 DIAGNOSIS — G4736 Sleep related hypoventilation in conditions classified elsewhere: Secondary | ICD-10-CM | POA: Diagnosis not present

## 2022-12-23 DIAGNOSIS — G4733 Obstructive sleep apnea (adult) (pediatric): Secondary | ICD-10-CM | POA: Diagnosis not present

## 2022-12-30 NOTE — Telephone Encounter (Signed)
Order was placed on 12/16/22 but I didn't see the order until today to process. It has now been sent to [employer]

## 2022-12-30 NOTE — Telephone Encounter (Signed)
I received message from Lurena Joiner with [facility] RE: D/C 02 Received: Today Shoffner, Silas [place], Karena Addison, [place]; Durward Fortes; Barnabas Harries; Able, Dolan Amen,  I entered the order for tomorrow to pick up her oxygen  Thank you

## 2023-01-13 DIAGNOSIS — G4733 Obstructive sleep apnea (adult) (pediatric): Secondary | ICD-10-CM | POA: Diagnosis not present

## 2023-01-22 ENCOUNTER — Encounter: Payer: Self-pay | Admitting: Medical

## 2023-01-22 ENCOUNTER — Ambulatory Visit (INDEPENDENT_AMBULATORY_CARE_PROVIDER_SITE_OTHER): Payer: Medicare Other | Admitting: Medical

## 2023-01-22 VITALS — BP 120/68 | HR 54 | Temp 97.8°F | Resp 16 | Ht 63.0 in | Wt 222.4 lb

## 2023-01-22 DIAGNOSIS — E785 Hyperlipidemia, unspecified: Secondary | ICD-10-CM

## 2023-01-22 DIAGNOSIS — R739 Hyperglycemia, unspecified: Secondary | ICD-10-CM

## 2023-01-22 DIAGNOSIS — R944 Abnormal results of kidney function studies: Secondary | ICD-10-CM

## 2023-01-22 DIAGNOSIS — I10 Essential (primary) hypertension: Secondary | ICD-10-CM | POA: Diagnosis not present

## 2023-01-22 NOTE — Progress Notes (Signed)
   Subjective:    Patient ID: Bianca Stephenson, female    DOB: 15-May-1956, 67 y.o.   MRN: [id number]  HPI Discussed the use of AI scribe software for clinical note transcription with the patient, who gave verbal consent to proceed.  History of Present Illness   The patient, with a history of hypertension and hyperlipidemia, reports that her blood pressure is typically well-controlled at home, often measuring around 118 systolic since starting on a CPAP machine. However, she notes that her blood pressure readings tend to be higher during clinic visits, with a recent home reading of 130 systolic prior to today's appointment. She is currently on Labetalol and Chlorthalidone for blood pressure management.  She has a history of elevated cholesterol levels but is not currently on a statin, expressing a preference to avoid such medication if possible. She has been fasting in preparation for today's lab work.  The patient's kidney function has been borderline, which she understands is somewhat age-related but can be preserved by controlling blood pressure and sugar levels. She has had slightly elevated sugar levels in the past, but not to a significant degree.  She has recently received the second dose of the shingles vaccine at a local pharmacy, but there seems to be a discrepancy in the record-keeping between the pharmacy and the clinic.        Review of Systems See hpi.    Objective:   Physical Exam  General Mental Status- Alert. General Appearance- Not in acute distress.   Skin General: Color- Normal Color. Moisture- Normal Moisture.  Neck Carotid Arteries- Normal color. Moisture- Normal Moisture. No carotid bruits. No JVD.  Chest and Lung Exam Auscultation: Breath Sounds:-Normal.  Cardiovascular Auscultation:Rythm- Regular. Murmurs & Other Heart Sounds:Auscultation of the heart reveals- No Murmurs.  Abdomen Inspection:-Inspeection Normal. Palpation/Percussion:Note:No mass. Palpation  and Percussion of the abdomen reveal- Non Tender, Non Distended + BS, no rebound or guarding.   Neurologic Cranial Nerve exam:- CN III-XII intact(No nystagmus), symmetric smile. Strength:- 5/5 equal and symmetric strength both upper and lower extremities.       Assessment & Plan:   Assessment and Plan    Hypertension: Controlled on Labetalol and Chlorthalidone. Noted improvement in blood pressure readings since starting CPAP therapy. -Continue current medications and CPAP therapy.  Hyperlipidemia: History of elevated cholesterol, currently not on statin therapy due to patient preference. -Check fasting lipid panel today. -Discuss 10-year cardiovascular risk score based on lab results.  Chronic Kidney Disease: Borderline GFR, likely age-related. Importance of preserving kidney function by controlling blood pressure and blood sugar emphasized. -Continue current blood pressure management. -Check renal function today.  Impaired Glucose Tolerance: Slightly elevated blood sugar noted in the past. -Check HbA1c today.  Immunization Status: Received shingles vaccine, but record not updated in system. -Request copy of vaccine record from [facility] to update immunization status.    Follow up date to be determined after lab review.      Esperanza Richters, PA-C

## 2023-01-22 NOTE — Patient Instructions (Addendum)
Hypertension: Controlled on Labetalol and Chlorthalidone. Noted improvement in blood pressure readings since starting CPAP therapy. -Continue current medications and CPAP therapy.  Hyperlipidemia: History of elevated cholesterol, currently not on statin therapy due to patient preference. -Check fasting lipid panel today. -Discuss 10-year cardiovascular risk score based on lab results.  Chronic Kidney Disease: Borderline GFR, likely age-related. Importance of preserving kidney function by controlling blood pressure and blood sugar emphasized. -Continue current blood pressure management. -Check renal function today.  Impaired Glucose Tolerance: Slightly elevated blood sugar noted in the past. -Check HbA1c today.  Immunization Status: Received shingles vaccine, but record not updated in system. -Request copy of vaccine record from [facility] to update immunization status.  Follow up date to be determined after lab review.

## 2023-01-23 LAB — COMPREHENSIVE METABOLIC PANEL
AG Ratio: 1.3 (calc) (ref 1.0–2.5)
ALT: 9 U/L (ref 6–29)
AST: 13 U/L (ref 10–35)
Albumin: 4 g/dL (ref 3.6–5.1)
Alkaline phosphatase (APISO): 46 U/L (ref 37–153)
BUN/Creatinine Ratio: 13 (calc) (ref 6–22)
BUN: 15 mg/dL (ref 7–25)
CO2: 29 mmol/L (ref 20–32)
Calcium: 9.3 mg/dL (ref 8.6–10.4)
Chloride: 97 mmol/L — ABNORMAL LOW (ref 98–110)
Creat: 1.15 mg/dL — ABNORMAL HIGH (ref 0.50–1.05)
Globulin: 3 g/dL (calc) (ref 1.9–3.7)
Glucose, Bld: 103 mg/dL — ABNORMAL HIGH (ref 65–99)
Potassium: 3.6 mmol/L (ref 3.5–5.3)
Sodium: 135 mmol/L (ref 135–146)
Total Bilirubin: 0.5 mg/dL (ref 0.2–1.2)
Total Protein: 7 g/dL (ref 6.1–8.1)

## 2023-01-23 LAB — LIPID PANEL
Cholesterol: 197 mg/dL (ref <200)
HDL: 56 mg/dL (ref >=50)
LDL Cholesterol (Calc): 122 mg/dL (calc) — ABNORMAL HIGH
Non-HDL Cholesterol (Calc): 141 mg/dL (calc) — ABNORMAL HIGH (ref <130)
Total CHOL/HDL Ratio: 3.5 (calc) (ref <5.0)
Triglycerides: 86 mg/dL (ref <150)

## 2023-01-23 LAB — HEMOGLOBIN A1C
Hgb A1c MFr Bld: 6.1 % of total Hgb — ABNORMAL HIGH (ref <5.7)
Mean Plasma Glucose: 128 mg/dL
eAG (mmol/L): 7.1 mmol/L

## 2023-02-09 ENCOUNTER — Other Ambulatory Visit (HOSPITAL_BASED_OUTPATIENT_CLINIC_OR_DEPARTMENT_OTHER): Payer: Self-pay | Admitting: Pulmonary Disease

## 2023-02-13 DIAGNOSIS — G4733 Obstructive sleep apnea (adult) (pediatric): Secondary | ICD-10-CM | POA: Diagnosis not present

## 2023-03-03 ENCOUNTER — Encounter (HOSPITAL_BASED_OUTPATIENT_CLINIC_OR_DEPARTMENT_OTHER): Payer: Self-pay | Admitting: Pulmonary Disease

## 2023-03-03 ENCOUNTER — Ambulatory Visit (HOSPITAL_BASED_OUTPATIENT_CLINIC_OR_DEPARTMENT_OTHER): Payer: Medicare Other | Admitting: Pulmonary Disease

## 2023-03-03 ENCOUNTER — Other Ambulatory Visit (HOSPITAL_BASED_OUTPATIENT_CLINIC_OR_DEPARTMENT_OTHER): Payer: Self-pay

## 2023-03-03 VITALS — BP 118/72 | HR 62 | Resp 16 | Ht 63.0 in | Wt 220.5 lb

## 2023-03-03 DIAGNOSIS — G4733 Obstructive sleep apnea (adult) (pediatric): Secondary | ICD-10-CM | POA: Diagnosis not present

## 2023-03-03 DIAGNOSIS — D869 Sarcoidosis, unspecified: Secondary | ICD-10-CM | POA: Diagnosis not present

## 2023-03-03 DIAGNOSIS — J42 Unspecified chronic bronchitis: Secondary | ICD-10-CM | POA: Diagnosis not present

## 2023-03-03 MED ORDER — FLUTICASONE-SALMETEROL 250-50 MCG/ACT IN AEPB
1.0000 | INHALATION_SPRAY | Freq: Two times a day (BID) | RESPIRATORY_TRACT | 5 refills | Status: DC
  Filled 2023-03-03: qty 60, 30d supply, fill #0
  Filled 2023-03-29: qty 60, 30d supply, fill #1
  Filled 2023-05-02: qty 60, 30d supply, fill #2
  Filled 2023-06-09: qty 60, 30d supply, fill #3
  Filled 2023-07-16: qty 60, 30d supply, fill #4
  Filled 2023-08-22: qty 60, 30d supply, fill #5

## 2023-03-03 NOTE — Patient Instructions (Signed)
Chronic bronchitis with wheezing - partially improved but persistent PFTs normal.  - INCREASE Advair Diskus 250-50 mcg ONE puff in the morning and evening. Rinse mouth out after use - CONTINUE Albuterol AS NEEDED for shortness of breath or wheezing  OSA The natural history, progression and prognosis of sleep apnea, treatment with PAP and alternative treatment strategies were discussed. The patient was also educated regarding the long term cardiovascular benefits of treating sleep apnea, including improved blood pressure control, reduction in MI and stroke risk as well as other potential benefits of treatment, such as improved glycemic control, facilitation of weight loss, improved energy during the day and improved sleep quality. --Counseled on sleep hygiene --Counseled on weight loss/maintenance of healthy weight --Counseled NOT to drive if/when sleepy --Advised patient to wear CPAP for at least 4 hours each night for greater than 70% of the time to avoid the machine being repossessed by insurance.

## 2023-03-03 NOTE — Progress Notes (Signed)
Subjective:   PATIENT ID: Bianca Stephenson GENDER: female DOB: [birth year], MRN: [id number]   HPI  Chief Complaint  Patient presents with   Follow-up    3 month fu-patient doing well about the same    Reason for Visit: Follow-up for sarcoidosis  [name] is a 67 year old with sarcoidosis and hypertension who presents for follow-up.  Synopsis: She was diagnosed skin biopsy 30 years ago. She was referred to Pulmonology in 1998 and had PFTs and underwent bronchoscopy five times. She was on prednisone and bronchodilators for period of time. She reports her symptoms at the time were shortness of breath with rare wheezing during exertion.  2022 - Established care at [facility]. Minimal respiratory symptoms.  04/03/21 Since our last visit she overall is doing well. Reports shortness of breath with exertion and rare wheezing with exertion. Tolerated PFTs. Denies cough, wheezing, leg swelling or chest pain.   09/30/22 Lost to follow-up. Denies shortness of breath, cough or wheezing at baseline. Wheezing only occurs with heavy exertion.  She has used albuterol for wheezing which would occur every few months. No limitations in ADLs. Denies snoring or witnessed apnea.  10/22/22 She continues to have wheezing occasionally with exertion. Uses albuterol. Denies shortness of breath or cough at baseline. No sleeping issues or night time awakening or snoring.   03/03/23 Since our last visit her she is overall the same. Denies shortness of breath or cough but still wheezes with activity. She is taking Advair 100 twice a day. She received CPAP in June and has been wearing it 7-9 hours nightly with AHI<3 in August per her [employer] app.  She still feels tired. Optho visit on 4/30 communicated that she has no active sarcoid.  Social History: Retired [employer] features editor  Past Medical History:  Diagnosis Date   Hypertension    Macular degeneration      Allergies  Allergen Reactions   Penicillins Other  (See Comments)     Outpatient Medications Prior to Visit  Medication Sig Dispense Refill   albuterol (VENTOLIN HFA) 108 (90 Base) MCG/ACT inhaler Inhale 2 puffs into the lungs every 6 (six) hours as needed for wheezing or shortness of breath. 8 g 1   alendronate (FOSAMAX) 70 MG tablet Take 1 tablet (70 mg total) by mouth every 7 (seven) days. Take with a full glass of water on an empty stomach. 4 tablet 11   chlorthalidone (HYGROTON) 25 MG tablet TAKE 1 TABLET BY MOUTH DAILY 30 tablet 3   labetalol (NORMODYNE) 300 MG tablet TAKE 1 TABLET BY MOUTH 2 TIMES A DAY 60 tablet 11   Multiple Vitamins-Minerals (PRESERVISION AREDS 2 PO) Take by mouth.     fluticasone-salmeterol (ADVAIR) 100-50 MCG/ACT AEPB INHALE 1 PUFF BY MOUTH 2 TIMES A DAY 60 each 2   No facility-administered medications prior to visit.    Review of Systems  Constitutional:  Negative for chills, diaphoresis, fever, malaise/fatigue and weight loss.  HENT:  Negative for congestion.   Respiratory:  Positive for wheezing. Negative for cough, hemoptysis, sputum production and shortness of breath.   Cardiovascular:  Negative for chest pain, palpitations and leg swelling.     Objective:   Vitals:   03/03/23 1339  BP: 118/72  Pulse: 62  Resp: 16  SpO2: 95%  Weight: 220 lb 7.4 oz (100 kg)  Height: 5\' 3"  (1.6 m)   SpO2: 95 %  Physical Exam: General: Well-appearing, no acute distress HENT: [place], AT Eyes: EOMI,  no scleral icterus Respiratory: Clear to auscultation bilaterally.  No crackles, wheezing or rales Cardiovascular: RRR, -M/R/G, no JVD Extremities:-Edema,-tenderness Neuro: AAO x4, CNII-XII grossly intact Psych: Normal mood, normal affect  Data Reviewed:  Imaging: CXR 11/01/20 - Chronic interstitial lung changes  PFT: 04/03/21 FVC 2.42 (78%) FEV1 1.83 (77%) Ratio 73  TLC 88% DLCO 79% Interpretation: No evidence of obstructive or restrictive defect. F-V loops suggest small airway obstruction. Corrected DLCO with  minimal reduction  10/09/22 FVC 2.32 (77%) FEV1 1.78 (77%) Ratio 77  TLC 84% DLCO 84% Interpretation: No obstructive or restrictive defect. Normal DLCO  Labs: CBC    Component Value Date/Time   WBC 7.0 07/28/2022 1042   RBC 4.01 07/28/2022 1042   HGB 12.0 07/28/2022 1042   HCT 35.6 (L) 07/28/2022 1042   PLT 265.0 07/28/2022 1042   MCV 89.0 07/28/2022 1042   MCHC 33.7 07/28/2022 1042   RDW 14.7 07/28/2022 1042   LYMPHSABS 1.5 07/28/2022 1042   MONOABS 0.6 07/28/2022 1042   EOSABS 0.1 07/28/2022 1042   BASOSABS 0.0 07/28/2022 1042      Latest Ref Rng & Units 01/22/2023   10:06 AM 07/28/2022   10:42 AM 08/13/2021   11:36 AM  CMP  Glucose 65 - 99 mg/dL 409  92  811   BUN 7 - 25 mg/dL 15  13  16    Creatinine 0.50 - 1.05 mg/dL 9.14  7.82  9.56   Sodium 135 - 146 mmol/L 135  134  135   Potassium 3.5 - 5.3 mmol/L 3.6  3.7  3.9   Chloride 98 - 110 mmol/L 97  94  95   CO2 20 - 32 mmol/L 29  30  35   Calcium 8.6 - 10.4 mg/dL 9.3  9.4  9.6   Total Protein 6.1 - 8.1 g/dL 7.0  7.3  7.1   Total Bilirubin 0.2 - 1.2 mg/dL 0.5  0.6  0.5   Alkaline Phos 39 - 117 U/L  44  45   AST 10 - 35 U/L 13  19  19    ALT 6 - 29 U/L 9  14  13        Latest Ref Rng & Units 01/21/2021    2:17 PM  Quantiferon TB Gold  Quantiferon TB Gold Plus NEGATIVE NEGATIVE    EKG 01/21/21 - Sinus bradycardia. Normal PR and Qtc interval.   ONO SpO2 < 88% 41 min 44 sec Nadir SpO2 64% Interpretation: Qualifies for oxygen    Assessment & Plan:   Discussion: 67 year old female with sarcoidosis involving skin and lung, HTN and OSA who presents for OSA follow-up. Compliant with CPAP. Continues to have some wheezing. 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  Last 10/09/22.  - F/u with ophthalmology annually. 10/2022 - no active disease - EKG reviewed. No evidence of conduction abnormalities on  01/2021 EKG  Chronic bronchitis with wheezing - partially improved but persistent PFTs normal.  - INCREASE Advair Diskus 250-50 mcg ONE puff in the morning and evening. Rinse mouth out after use - CONTINUE Albuterol AS NEEDED for shortness of breath or wheezing  OSA The natural history, progression and prognosis of sleep apnea, treatment with PAP and alternative treatment strategies were discussed. The patient was also educated regarding the long term cardiovascular benefits of treating sleep apnea, including improved blood pressure control, reduction in  MI and stroke risk as well as other potential benefits of treatment, such as improved glycemic control, facilitation of weight loss, improved energy during the day and improved sleep quality. --Counseled on sleep hygiene --Counseled on weight loss/maintenance of healthy weight --Counseled NOT to drive if/when sleepy --Advised patient to wear CPAP for at least 4 hours each night for greater than 70% of the time to avoid the machine being repossessed by insurance.   Health Maintenance Immunization History  Administered Date(s) Administered   COVID-19, mRNA, vaccine(Comirnaty)12 years and older 10/16/2022   Influenza-Unspecified 05/01/2021, 05/01/2022   PFIZER(Purple Top)SARS-COV-2 Vaccination 09/21/2019, 10/13/2019, 06/05/2020   PNEUMOCOCCAL CONJUGATE-20 07/28/2022   Respiratory Syncytial Virus Vaccine,Recomb Aduvanted(Arexvy) 05/01/2022   Tdap 05/13/2021   CT Lung Screen - not indicated  No orders of the defined types were placed in this encounter.  Meds ordered this encounter  Medications   fluticasone-salmeterol (ADVAIR) 250-50 MCG/ACT AEPB    Sig: Inhale 1 puff into the lungs every 12 (twelve) hours.    Dispense:  60 each    Refill:  5   Return in about 6 months (around 09/03/2023).  I have spent a total time of 33-minutes on the day of the appointment including chart review, data review, collecting history, coordinating care  and discussing medical diagnosis and plan with the patient/family. Past medical history, allergies, medications were reviewed. Pertinent imaging, labs and tests included in this note have been reviewed and interpreted independently by me.   Conda Wannamaker Mechele Collin, MD [facility] 03/03/2023 2:30 PM  Office Number [phone]

## 2023-03-25 ENCOUNTER — Other Ambulatory Visit (HOSPITAL_BASED_OUTPATIENT_CLINIC_OR_DEPARTMENT_OTHER): Payer: Self-pay

## 2023-03-25 MED ORDER — INFLUENZA VAC A&B SURF ANT ADJ 0.5 ML IM SUSY
0.5000 mL | PREFILLED_SYRINGE | Freq: Once | INTRAMUSCULAR | 0 refills | Status: AC
  Filled 2023-03-25: qty 0.5, 1d supply, fill #0

## 2023-03-25 MED ORDER — COVID-19 MRNA VAC-TRIS(PFIZER) 30 MCG/0.3ML IM SUSY
0.3000 mL | PREFILLED_SYRINGE | Freq: Once | INTRAMUSCULAR | 0 refills | Status: AC
  Filled 2023-03-25: qty 0.3, 1d supply, fill #0

## 2023-03-29 ENCOUNTER — Other Ambulatory Visit (HOSPITAL_BASED_OUTPATIENT_CLINIC_OR_DEPARTMENT_OTHER): Payer: Self-pay

## 2023-04-13 ENCOUNTER — Other Ambulatory Visit: Payer: Self-pay | Admitting: Medical

## 2023-04-28 NOTE — Progress Notes (Signed)
[facility] - Clinic Note  05/05/2023   CHIEF COMPLAINT Patient presents for Retina Follow Up  HISTORY OF PRESENT ILLNESS: Bianca Stephenson is a 67 y.o. female who presents to the clinic today for:  HPI     Retina Follow Up   In both eyes.  This started 6 months ago.  Duration of 6 months.  Since onset it is stable.  I, the attending physician,  performed the HPI with the patient and updated documentation appropriately.        Comments   6 month retina follow up sarcoidosis pt is reporting no vision changes noticed she denies any flashes or floaters she is taking AREDS and started taking only once per day due to upsetting her stomach       Last edited by Rennis Chris, MD on 05/05/2023 12:42 PM.    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  Referring physician: Luciano Cutter, MD [address] [place],  [place] [address]  HISTORICAL INFORMATION:  Selected notes from the MEDICAL RECORD NUMBER Referred by Dr. Everardo All for sarcoidosis LEE:  Ocular Hx- PMH-   CURRENT MEDICATIONS: No current outpatient medications on file. (Ophthalmic Drugs)   No current facility-administered medications for this visit. (Ophthalmic Drugs)   Current Outpatient Medications (Other)  Medication Sig   albuterol (VENTOLIN HFA) 108 (90 Base) MCG/ACT inhaler Inhale 2 puffs into the lungs every 6 (six) hours as needed for wheezing or shortness of breath.   alendronate (FOSAMAX) 70 MG tablet Take 1 tablet (70 mg total) by mouth every 7 (seven) days. Take with a full glass of water on an empty stomach.   chlorthalidone (HYGROTON) 25 MG tablet TAKE 1 TABLET BY MOUTH DAILY   fluticasone-salmeterol (ADVAIR) 250-50 MCG/ACT AEPB Inhale 1 puff into the lungs every 12 (twelve) hours.   labetalol (NORMODYNE) 300 MG tablet TAKE 1 TABLET BY MOUTH 2 TIMES A DAY   Multiple Vitamins-Minerals (PRESERVISION AREDS 2 PO) Take by mouth.   No current  facility-administered medications for this visit. (Other)   REVIEW OF SYSTEMS: ROS   Positive for: Cardiovascular, Eyes, Allergic/Imm Last edited by Etheleen Mayhew, COT on 05/05/2023  9:40 AM.      ALLERGIES Allergies  Allergen Reactions   Penicillins Other (See Comments)   PAST MEDICAL HISTORY Past Medical History:  Diagnosis Date   Hypertension    Macular degeneration    History reviewed. No pertinent surgical history. FAMILY HISTORY Family History  Problem Relation Age of Onset   Hypertension Mother    Alzheimer's disease Mother    SOCIAL HISTORY Social History   Tobacco Use   Smoking status: Former    Current packs/day: 0.00    Average packs/day: 1 pack/day for 19.3 years (19.3 ttl pk-yrs)    Types: Cigarettes    Start date: 45    Quit date: 11/01/1996    Years since quitting: 26.5   Smokeless tobacco: Never  Substance Use Topics   Alcohol use: Never   Drug use: Never       OPHTHALMIC EXAM:  Base Eye Exam     Visual Acuity (Snellen - Linear)       Right Left   Dist [place] 20/40 20/50 -2   Dist ph [place] 20/20 -2 20/20 -1         Tonometry (Tonopen, 9:49 AM)       Right Left   Pressure 13 12  Pupils       Pupils Dark Light Shape React APD   Right PERRL 3 2 Round Brisk None   Left PERRL 3 2 Round Brisk None         Visual Fields       Left Right    Full Full         Extraocular Movement       Right Left    Full, Ortho Full, Ortho         Dilation     Both eyes: 2.5% Phenylephrine @ 9:49 AM           Slit Lamp and Fundus Exam     Slit Lamp Exam       Right Left   Lids/Lashes Dermatochalasis - upper lid Dermatochalasis - upper lid   Conjunctiva/Sclera White and quiet White and quiet   Cornea Trace tear film debris, 1+ Punctate epithelial erosions Trace tear film debris, 2+ inferior Punctate epithelial erosions   Anterior Chamber deep and clear deep and clear   Iris Round and dilated Round and dilated    Lens 2+ Nuclear sclerosis, 2+ Cortical cataract 2+ Nuclear sclerosis with mild brunescence, 2-3+ Cortical cataract   Anterior Vitreous mild syneresis mild syneresis         Fundus Exam       Right Left   Disc Pink and Sharp Pink and Sharp   C/D Ratio 0.2 0.3   Macula Flat, Blunted foveal reflex, Drusen, RPE mottling and clumping, No heme or edema Flat, Blunted foveal reflex, Drusen, RPE mottling, clumping and early atropy, No heme or edema   Vessels attenuated, Tortuous, no sheathing attenuated, Tortuous, no sheathing   Periphery Attached, reticular degeneration, No heme Attached, reticular degeneration, No heme, peripheral cystoid degeneration, scattered, punctate pigmented CR scars           Refraction     Wearing Rx       Sphere Cylinder Axis   Right -1.25 +1.25 021   Left -1.50 +1.75 160           IMAGING AND PROCEDURES  Imaging and Procedures for 05/05/2023  OCT, Retina - OU - Both Eyes       Right Eye Quality was good. Central Foveal Thickness: 256. Progression has been stable. Findings include normal foveal contour, no IRF, no SRF, retinal drusen , vitreomacular adhesion .   Left Eye Quality was good. Central Foveal Thickness: 268. Progression has been stable. Findings include normal foveal contour, no IRF, no SRF, retinal drusen , epiretinal membrane, vitreomacular adhesion .   Notes *Images captured and stored on drive  Diagnosis / Impression:  NFP, no IRF/SRF OU +drusen OU  Clinical management:  See below  Abbreviations: NFP - Normal foveal profile. CME - cystoid macular edema. PED - pigment epithelial detachment. IRF - intraretinal fluid. SRF - subretinal fluid. EZ - ellipsoid zone. ERM - epiretinal membrane. ORA - outer retinal atrophy. ORT - outer retinal tubulation. SRHM - subretinal hyper-reflective material. IRHM - intraretinal hyper-reflective material      Fluorescein Angiography Optos (Transit OS)       Right Eye Progression has  been stable. Early phase findings include staining (Punctate peripheral staining). Mid/Late phase findings include staining (Mild vascular non-perfusion far periphery; scattered peripheral staining -- mild; no leakage).   Left Eye Progression has improved. Early phase findings include delayed filling, staining (Punctate peripheral staining). Mid/Late phase findings include leakage, staining, vascular perfusion defect (Mild vascular non perfusion far  periphery; focal perivascular leakage temporal periphery-- improved; scattered peripheral staining -- mild; mild late perifoveal staining nasal macula-- improved).   Notes **Images stored on drive**  Impression: OD: Mild vascular non-perfusion far periphery; punctate peripheral staining -- mild; no leakage OS: Mild vascular non perfusion far periphery; focal perivascular leakage temporal periphery-- improved; scattered peripheral staining -- mild; mild late perifoveal staining nasal macula-- improved No leakage or inflammation OU          ASSESSMENT/PLAN:   ICD-10-CM   1. Sarcoidosis  D86.9 OCT, Retina - OU - Both Eyes    Fluorescein Angiography Optos (Transit OS)    2. Intermediate stage nonexudative age-related macular degeneration of both eyes  H35.3132     3. Essential hypertension  I10     4. Hypertensive retinopathy of both eyes  H35.033 OCT, Retina - OU - Both Eyes    Fluorescein Angiography Optos (Transit OS)    5. Combined forms of age-related cataract of both eyes  H25.813       Sarcoidosis - diagnosed by skin biopsy during age 34s - history of breathing issues and previously on prednisone and bronchodilators - denies any history of any ophthalmic or vision issues - exam shows no significant active inflammation in the eyes  OU, but OS with punctate pigmented chorioretinal scars that could represent focal episodes of inflammation in the past - FA 04.30.24 with mild focal perivascular leakage in far temporal periphery OS --  minimal concern for active ocular inflammation OU - repeat FA (10.30.24) shows OD: Mild vascular non-perfusion far periphery; scattered peripheral staining -- mild; no leakage; OS: Mild vascular non perfusion far periphery; focal perivascular leakage temporal periphery-- improved; scattered peripheral staining -- mild; mild late perifoveal staining nasal macula-- improved  -- again no active ocular inflammation OU - no retinal or ophthalmic interventions indicated or recommended   - will monitor  - f/u 34yr, DFE, OCT  2. Age related macular degeneration, non-exudative, both eyes  - intermediate stage with scattered drusen - The incidence, anatomy, and pathology of dry AMD, risk of progression, and the AREDS and AREDS 2 study including smoking risks discussed with patient.  - Recommend amsler grid monitoring  - f/u 1 yr months, DFE, OCT  3,4. Hypertensive retinopathy OU - discussed importance of tight BP control - monitor  5. Mixed Cataract OU - The symptoms of cataract, surgical options, and treatments and risks were discussed with patient. - discussed diagnosis and progression - monitor   Ophthalmic Meds Ordered this visit:  No orders of the defined types were placed in this encounter.    Return in about 1 year (around 05/04/2024) for f/u nonex ARMD and sarcoidosis , DFE, OCT.  There are no Patient Instructions on file for this visit.  Explained the diagnoses, plan, and follow up with the patient and they expressed understanding.  Patient expressed understanding of the importance of proper follow up care.   This document serves as a record of services personally performed by Karie Chimera, MD, PhD. It was created on their behalf by De Blanch, an ophthalmic technician. The creation of this record is the provider's dictation and/or activities during the visit.    Electronically signed by: De Blanch, OA, 05/05/23  12:48 PM  This document serves as a record of services  personally performed by Karie Chimera, MD, PhD. It was created on their behalf by Charlette Caffey, COT an ophthalmic technician. The creation of this record is the provider's dictation and/or activities  during the visit.    Electronically signed by:  Charlette Caffey, COT  05/05/23 12:48 PM  Karie Chimera, M.D., Ph.D. Diseases & Surgery of the Retina and Vitreous [facility] 05/05/2023  I have reviewed the above documentation for accuracy and completeness, and I agree with the above. Karie Chimera, M.D., Ph.D. 05/05/23 12:48 PM   Abbreviations: M myopia (nearsighted); A astigmatism; H hyperopia (farsighted); P presbyopia; Mrx spectacle prescription;  CTL contact lenses; OD right eye; OS left eye; OU both eyes  XT exotropia; ET esotropia; PEK punctate epithelial keratitis; PEE punctate epithelial erosions; DES dry eye syndrome; MGD meibomian gland dysfunction; ATs artificial tears; PFAT's preservative free artificial tears; NSC nuclear sclerotic cataract; PSC posterior subcapsular cataract; ERM epi-retinal membrane; PVD posterior vitreous detachment; RD retinal detachment; DM diabetes mellitus; DR diabetic retinopathy; NPDR non-proliferative diabetic retinopathy; PDR proliferative diabetic retinopathy; CSME clinically significant macular edema; DME diabetic macular edema; dbh dot blot hemorrhages; CWS cotton wool spot; POAG primary open angle glaucoma; C/D cup-to-disc ratio; HVF humphrey visual field; GVF goldmann visual field; OCT optical coherence tomography; IOP intraocular pressure; BRVO Branch retinal vein occlusion; CRVO central retinal vein occlusion; CRAO central retinal artery occlusion; BRAO branch retinal artery occlusion; RT retinal tear; SB scleral buckle; PPV pars plana vitrectomy; VH Vitreous hemorrhage; PRP panretinal laser photocoagulation; IVK intravitreal kenalog; VMT vitreomacular traction; MH Macular hole;  NVD neovascularization of the disc; NVE  neovascularization elsewhere; AREDS age related eye disease study; ARMD age related macular degeneration; POAG primary open angle glaucoma; EBMD epithelial/anterior basement membrane dystrophy; ACIOL anterior chamber intraocular lens; IOL intraocular lens; PCIOL posterior chamber intraocular lens; Phaco/IOL phacoemulsification with intraocular lens placement; PRK photorefractive keratectomy; LASIK laser assisted in situ keratomileusis; HTN hypertension; DM diabetes mellitus; COPD chronic obstructive pulmonary disease

## 2023-05-05 ENCOUNTER — Ambulatory Visit (INDEPENDENT_AMBULATORY_CARE_PROVIDER_SITE_OTHER): Payer: Medicare Other | Admitting: Ophthalmology

## 2023-05-05 ENCOUNTER — Encounter (INDEPENDENT_AMBULATORY_CARE_PROVIDER_SITE_OTHER): Payer: Self-pay | Admitting: Ophthalmology

## 2023-05-05 VITALS — BP 142/84 | HR 62

## 2023-05-05 DIAGNOSIS — H25813 Combined forms of age-related cataract, bilateral: Secondary | ICD-10-CM | POA: Diagnosis not present

## 2023-05-05 DIAGNOSIS — H35033 Hypertensive retinopathy, bilateral: Secondary | ICD-10-CM | POA: Diagnosis not present

## 2023-05-05 DIAGNOSIS — H353132 Nonexudative age-related macular degeneration, bilateral, intermediate dry stage: Secondary | ICD-10-CM | POA: Diagnosis not present

## 2023-05-05 DIAGNOSIS — I10 Essential (primary) hypertension: Secondary | ICD-10-CM | POA: Diagnosis not present

## 2023-05-05 DIAGNOSIS — D869 Sarcoidosis, unspecified: Secondary | ICD-10-CM | POA: Diagnosis not present

## 2023-07-16 ENCOUNTER — Other Ambulatory Visit (HOSPITAL_BASED_OUTPATIENT_CLINIC_OR_DEPARTMENT_OTHER): Payer: Self-pay

## 2023-07-30 DIAGNOSIS — G4733 Obstructive sleep apnea (adult) (pediatric): Secondary | ICD-10-CM | POA: Diagnosis not present

## 2023-08-07 NOTE — Telephone Encounter (Signed)
I hav [employer] form to sign for cpap supplies. Pt sees pulmonologist for sleep apnea. Will you call [employer] and provide them with name of pulmonlogist office etc. See recent up pulmonologist note. Thought about facing over form I have but has my name and npi number. I think since pulmonlogist seeing her best to send form there.

## 2023-08-14 ENCOUNTER — Other Ambulatory Visit: Payer: Self-pay | Admitting: Medical

## 2023-08-16 NOTE — Telephone Encounter (Signed)
 6 month since last saw pt. Want to recheck bp and follow up on chronic conditions. Will you get pt scheduled for follow up.

## 2023-08-17 ENCOUNTER — Telehealth: Payer: Self-pay

## 2023-08-17 NOTE — Telephone Encounter (Signed)
Pt called and lvm to return call and schedule an appt

## 2023-08-17 NOTE — Telephone Encounter (Signed)
6 month since last saw pt. Want to recheck bp and follow up on chronic conditions. Will you get pt scheduled for follow up.    Pt called and lvm to return call to schedule an appointment

## 2023-08-23 ENCOUNTER — Other Ambulatory Visit (HOSPITAL_BASED_OUTPATIENT_CLINIC_OR_DEPARTMENT_OTHER): Payer: Self-pay

## 2023-08-26 ENCOUNTER — Other Ambulatory Visit: Payer: Self-pay | Admitting: Medical

## 2023-08-26 ENCOUNTER — Other Ambulatory Visit (HOSPITAL_BASED_OUTPATIENT_CLINIC_OR_DEPARTMENT_OTHER): Payer: Self-pay

## 2023-08-26 MED ORDER — ALENDRONATE SODIUM 70 MG PO TABS
70.0000 mg | ORAL_TABLET | ORAL | 3 refills | Status: DC
  Filled 2023-08-26: qty 12, 84d supply, fill #0
  Filled 2023-11-18: qty 12, 84d supply, fill #1
  Filled 2024-02-06: qty 12, 84d supply, fill #2
  Filled 2024-05-01: qty 12, 84d supply, fill #3

## 2023-09-15 ENCOUNTER — Encounter (HOSPITAL_BASED_OUTPATIENT_CLINIC_OR_DEPARTMENT_OTHER): Payer: Self-pay | Admitting: Pulmonary Disease

## 2023-09-15 ENCOUNTER — Ambulatory Visit (HOSPITAL_BASED_OUTPATIENT_CLINIC_OR_DEPARTMENT_OTHER): Payer: Medicare Other | Admitting: Pulmonary Disease

## 2023-09-15 VITALS — BP 128/84 | HR 76 | Ht 63.0 in | Wt 222.6 lb

## 2023-09-15 DIAGNOSIS — D869 Sarcoidosis, unspecified: Secondary | ICD-10-CM

## 2023-09-15 DIAGNOSIS — J42 Unspecified chronic bronchitis: Secondary | ICD-10-CM | POA: Diagnosis not present

## 2023-09-15 DIAGNOSIS — G4733 Obstructive sleep apnea (adult) (pediatric): Secondary | ICD-10-CM | POA: Diagnosis not present

## 2023-09-15 NOTE — Patient Instructions (Signed)
 Chronic bronchitis with wheezing - partially improved but persistent PFTs normal.  - HOLD on Advair due to dry mouth as bronchodilators at low and medium dose not effective. If symptoms recur please contact us to start lower dose - CONTINUE Albuterol AS NEEDED for shortness of breath or wheezing  OSA --Please send me your 30 day compliance report on your app --Counseled on sleep hygiene --Counseled on weight loss/maintenance of healthy weight --Counseled NOT to drive if/when sleepy --Advised patient to wear CPAP for at least 4 hours each night for greater than 70% of the time to avoid the machine being repossessed by insurance.

## 2023-09-15 NOTE — Progress Notes (Signed)
 Subjective:   PATIENT ID: Bianca Stephenson GENDER: female DOB: May 26, 1956, MRN: [id number]   HPI  Chief Complaint  Patient presents with   Follow-up    Sarcoidosis    Reason for Visit: Follow-up for sarcoidosis  [name] is a 68 year old with sarcoidosis and hypertension who presents for follow-up.  Synopsis: She was diagnosed skin biopsy 30 years ago. She was referred to Pulmonology in 1998 and had PFTs and underwent bronchoscopy five times. She was on prednisone and bronchodilators for period of time. She reports her symptoms at the time were shortness of breath with rare wheezing during exertion.  2022 - Established care at [facility]. Minimal respiratory symptoms.  04/03/21 Since our last visit she overall is doing well. Reports shortness of breath with exertion and rare wheezing with exertion. Tolerated PFTs. Denies cough, wheezing, leg swelling or chest pain.   09/30/22 Lost to follow-up. Denies shortness of breath, cough or wheezing at baseline. Wheezing only occurs with heavy exertion.  She has used albuterol for wheezing which would occur every few months. No limitations in ADLs. Denies snoring or witnessed apnea.  10/22/22 She continues to have wheezing occasionally with exertion. Uses albuterol. Denies shortness of breath or cough at baseline. No sleeping issues or night time awakening or snoring.   03/03/23 Since our last visit her she is overall the same. Denies shortness of breath or cough but still wheezes with activity. She is taking Advair 100 twice a day. She received CPAP in June and has been wearing it 7-9 hours nightly with AHI<3 in August per her [employer] app.  She still feels tired. Optho visit on 4/30 communicated that she has no active sarcoid.  09/15/23 Since our last visit she is overall doing well respiratory wise. Has been compliant CPAP and has noticed blood pressure improved. Complains of dry mouth but already using humidifier. Compliant with Advair but  no difference with increased strength with occasional wheezing.   Social History: Retired [employer] features editor  Past Medical History:  Diagnosis Date   Hypertension    Macular degeneration      Allergies  Allergen Reactions   Penicillins Other (See Comments)     Outpatient Medications Prior to Visit  Medication Sig Dispense Refill   albuterol (VENTOLIN HFA) 108 (90 Base) MCG/ACT inhaler Inhale 2 puffs into the lungs every 6 (six) hours as needed for wheezing or shortness of breath. 8 g 1   alendronate (FOSAMAX) 70 MG tablet Take 1 tablet (70 mg total) by mouth every 7 (seven) days. Take with a full glass of water on an empty stomach. 12 tablet 3   chlorthalidone (HYGROTON) 25 MG tablet TAKE 1 TABLET BY MOUTH DAILY 30 tablet 3   labetalol (NORMODYNE) 300 MG tablet TAKE 1 TABLET BY MOUTH 2 TIMES A DAY 60 tablet 11   fluticasone-salmeterol (ADVAIR) 250-50 MCG/ACT AEPB Inhale 1 puff into the lungs every 12 (twelve) hours. 60 each 5   Multiple Vitamins-Minerals (PRESERVISION AREDS 2 PO) Take by mouth.     No facility-administered medications prior to visit.    Review of Systems  Constitutional:  Negative for chills, diaphoresis, fever, malaise/fatigue and weight loss.  HENT:  Negative for congestion.   Respiratory:  Positive for wheezing. Negative for cough, hemoptysis, sputum production and shortness of breath.   Cardiovascular:  Negative for chest pain, palpitations and leg swelling.     Objective:   Vitals:   09/15/23 1302  BP: 128/84  Pulse: 76  SpO2: 96%  Weight: 222 lb 9.6 oz (101 kg)  Height: 5\' 3"  (1.6 m)    SpO2: 96 %  Physical Exam: General: Well-appearing, no acute distress HENT: [place], AT Eyes: EOMI, no scleral icterus Respiratory: Clear to auscultation bilaterally.  No crackles, wheezing or rales Cardiovascular: RRR, -M/R/G, no JVD Extremities:-Edema,-tenderness Neuro: AAO x4, CNII-XII grossly intact Psych: Normal mood, normal affect  Data  Reviewed:  Imaging: CXR 11/01/20 - Chronic interstitial lung changes  PFT: 04/03/21 FVC 2.42 (78%) FEV1 1.83 (77%) Ratio 73  TLC 88% DLCO 79% Interpretation: No evidence of obstructive or restrictive defect. F-V loops suggest small airway obstruction. Corrected DLCO with minimal reduction  10/09/22 FVC 2.32 (77%) FEV1 1.78 (77%) Ratio 77  TLC 84% DLCO 84% Interpretation: No obstructive or restrictive defect. Normal DLCO  Labs: CBC    Component Value Date/Time   WBC 7.0 07/28/2022 1042   RBC 4.01 07/28/2022 1042   HGB 12.0 07/28/2022 1042   HCT 35.6 (L) 07/28/2022 1042   PLT 265.0 07/28/2022 1042   MCV 89.0 07/28/2022 1042   MCHC 33.7 07/28/2022 1042   RDW 14.7 07/28/2022 1042   LYMPHSABS 1.5 07/28/2022 1042   MONOABS 0.6 07/28/2022 1042   EOSABS 0.1 07/28/2022 1042   BASOSABS 0.0 07/28/2022 1042      Latest Ref Rng & Units 01/22/2023   10:06 AM 07/28/2022   10:42 AM 08/13/2021   11:36 AM  CMP  Glucose 65 - 99 mg/dL 161  92  096   BUN 7 - 25 mg/dL 15  13  16    Creatinine 0.50 - 1.05 mg/dL 0.45  4.09  8.11   Sodium 135 - 146 mmol/L 135  134  135   Potassium 3.5 - 5.3 mmol/L 3.6  3.7  3.9   Chloride 98 - 110 mmol/L 97  94  95   CO2 20 - 32 mmol/L 29  30  35   Calcium 8.6 - 10.4 mg/dL 9.3  9.4  9.6   Total Protein 6.1 - 8.1 g/dL 7.0  7.3  7.1   Total Bilirubin 0.2 - 1.2 mg/dL 0.5  0.6  0.5   Alkaline Phos 39 - 117 U/L  44  45   AST 10 - 35 U/L 13  19  19    ALT 6 - 29 U/L 9  14  13        Latest Ref Rng & Units 01/21/2021    2:17 PM  Quantiferon TB Gold  Quantiferon TB Gold Plus NEGATIVE NEGATIVE    EKG 01/21/21 - Sinus bradycardia. Normal PR and Qtc interval.   ONO SpO2 < 88% 41 min 44 sec Nadir SpO2 64% Interpretation: Qualifies for oxygen      Assessment & Plan:   Discussion: 68 year old female with sarcoidosis involving skin and lung, HTN and OSA who presents for OSA follow-up. Compliant with CPAP. Continues to have some wheezing. 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  Last 10/09/22.  - F/u with ophthalmology annually. 10/2022 - no active disease - EKG reviewed. No evidence of conduction abnormalities on 01/2021 EKG  Chronic bronchitis with wheezing - partially improved but persistent PFTs normal.  - HOLD on Advair due to dry mouth as bronchodilators at low and medium dose not effective. If symptoms recur please contact us to start lower dose - CONTINUE Albuterol AS NEEDED for shortness  of breath or wheezing  OSA --Please send me your 30 day compliance report on your app  >Reviewed CPAP compliance report from2/10/25-3/11/25. AHI well controlled <5 --Counseled on sleep hygiene --Counseled on weight loss/maintenance of healthy weight --Counseled NOT to drive if/when sleepy --Advised patient to wear CPAP for at least 4 hours each night for greater than 70% of the time to avoid the machine being repossessed by insurance.  Health Maintenance Immunization History  Administered Date(s) Administered   Fluad Trivalent(High Dose 65+) 03/25/2023   Influenza-Unspecified 05/01/2021, 05/01/2022   PFIZER(Purple Top)SARS-COV-2 Vaccination 09/21/2019, 10/13/2019, 06/05/2020   PNEUMOCOCCAL CONJUGATE-20 07/28/2022   Pfizer(Comirnaty)Fall Seasonal Vaccine 12 years and older 10/16/2022, 03/25/2023   Respiratory Syncytial Virus Vaccine,Recomb Aduvanted(Arexvy) 05/01/2022   Tdap 05/13/2021   CT Lung Screen - not indicated  No orders of the defined types were placed in this encounter.  No orders of the defined types were placed in this encounter.  Return in about 7 months (around 04/16/2024).  I have spent a total time of 33-minutes on the day of the appointment including chart review, data review, collecting history, coordinating care and discussing medical diagnosis and plan with the patient/family. Past medical  history, allergies, medications were reviewed. Pertinent imaging, labs and tests included in this note have been reviewed and interpreted independently by me.   Bianca Morro Mechele Collin, MD [facility] 09/15/2023 1:53 PM  Office Number [phone]

## 2023-09-17 ENCOUNTER — Ambulatory Visit (INDEPENDENT_AMBULATORY_CARE_PROVIDER_SITE_OTHER): Admitting: Medical

## 2023-09-17 ENCOUNTER — Encounter: Payer: Self-pay | Admitting: Medical

## 2023-09-17 VITALS — BP 125/70 | HR 62 | Temp 98.2°F | Resp 18 | Ht 63.0 in | Wt 222.6 lb

## 2023-09-17 DIAGNOSIS — I10 Essential (primary) hypertension: Secondary | ICD-10-CM

## 2023-09-17 DIAGNOSIS — R944 Abnormal results of kidney function studies: Secondary | ICD-10-CM

## 2023-09-17 DIAGNOSIS — E785 Hyperlipidemia, unspecified: Secondary | ICD-10-CM

## 2023-09-17 DIAGNOSIS — R739 Hyperglycemia, unspecified: Secondary | ICD-10-CM | POA: Diagnosis not present

## 2023-09-17 LAB — COMPREHENSIVE METABOLIC PANEL
ALT: 12 U/L (ref 0–35)
AST: 17 U/L (ref 0–37)
Albumin: 4.6 g/dL (ref 3.5–5.2)
Alkaline Phosphatase: 47 U/L (ref 39–117)
BUN: 20 mg/dL (ref 6–23)
CO2: 30 meq/L (ref 19–32)
Calcium: 9.8 mg/dL (ref 8.4–10.5)
Chloride: 94 meq/L — ABNORMAL LOW (ref 96–112)
Creatinine, Ser: 1.12 mg/dL (ref 0.40–1.20)
GFR: 51.04 mL/min — ABNORMAL LOW (ref >60.00)
Glucose, Bld: 105 mg/dL — ABNORMAL HIGH (ref 70–99)
Potassium: 3.8 meq/L (ref 3.5–5.1)
Sodium: 133 meq/L — ABNORMAL LOW (ref 135–145)
Total Bilirubin: 0.6 mg/dL (ref 0.2–1.2)
Total Protein: 7.5 g/dL (ref 6.0–8.3)

## 2023-09-17 LAB — LIPID PANEL
Cholesterol: 212 mg/dL — ABNORMAL HIGH (ref 0–200)
HDL: 60 mg/dL (ref >39.00)
LDL Cholesterol: 136 mg/dL — ABNORMAL HIGH (ref 0–99)
NonHDL: 151.92
Total CHOL/HDL Ratio: 4
Triglycerides: 78 mg/dL (ref 0.0–149.0)
VLDL: 15.6 mg/dL (ref 0.0–40.0)

## 2023-09-17 LAB — HEMOGLOBIN A1C: Hgb A1c MFr Bld: 6 % (ref 4.6–6.5)

## 2023-09-17 NOTE — Progress Notes (Signed)
 Subjective:    Patient ID: Bianca Stephenson, female    DOB: Aug 27, 1955, 68 y.o.   MRN: [id number]  HPI   Discussed the use of AI scribe software for clinical note transcription with the patient, who gave verbal consent to proceed.  History of Present Illness   Bianca Stephenson "Bianca Stephenson" is a 68 year old female with hypertension, high cholesterol, and borderline kidney function who presents for a blood test consultation.  She has been undergoing regular blood work since July of the previous year due to concerns about her kidney function. Her creatinine level is 1.15 mg/dL, slightly elevated above the normal range, and her glomerular filtration rate (GFR) is 59 mL/min/1.73 m. She does not frequently use non-steroidal anti-inflammatory drugs and maintains hydration by drinking water throughout the day.  Her blood pressure is generally well-controlled with home readings around 122/80 mmHg. She is currently on labetalol 300 mg twice daily and chlorthalidone 25 mg daily. The addition of a diuretic has been beneficial in controlling her blood pressure.  She has experienced mild sugar elevation in the past, with a recent home glucose reading of 103 mg/dL. She uses a device from '[employer]' to monitor her sugar levels.  Her cholesterol was previously noted to be slightly elevated at 122 mg/dL. A current lipid panel will determine if there is a need for medication.  No chest pain, shortness of breath, or palpitations. She is well-hydrated before her blood work and mentions having a 'white coat' effect that may cause her blood pressure to rise during medical visits.         Review of Systems  Constitutional:  Negative for chills, fatigue and fever.  HENT:  Negative for congestion and drooling.   Respiratory:  Negative for cough, chest tightness and wheezing.   Cardiovascular:  Negative for chest pain and palpitations.  Gastrointestinal:  Negative for abdominal pain.  Genitourinary:  Negative for dysuria and flank  pain.  Musculoskeletal:  Negative for back pain and joint swelling.  Skin:  Negative for rash.  Neurological:  Negative for dizziness, speech difficulty, weakness, numbness and headaches.  Hematological:  Negative for adenopathy. Does not bruise/bleed easily.  Psychiatric/Behavioral:  Negative for behavioral problems and decreased concentration.    Past Medical History:  Diagnosis Date   Hypertension    Macular degeneration      Social History   Socioeconomic History   Marital status: Married    Spouse name: Not on file   Number of children: Not on file   Years of education: Not on file   Highest education level: Associate degree: academic program  Occupational History   Occupation: retired  Tobacco Use   Smoking status: Former    Current packs/day: 0.00    Average packs/day: 1 pack/day for 19.3 years (19.3 ttl pk-yrs)    Types: Cigarettes    Start date: 38    Quit date: 11/01/1996    Years since quitting: 26.8   Smokeless tobacco: Never  Substance and Sexual Activity   Alcohol use: Never   Drug use: Never   Sexual activity: Yes    Birth control/protection: None  Other Topics Concern   Not on file  Social History Narrative   Not on file   Social Drivers of Health   Financial Resource Strain: Low Risk  (09/16/2023)   Overall Financial Resource Strain (CARDIA)    Difficulty of Paying Living Expenses: Not hard at all  Food Insecurity: No Food Insecurity (09/16/2023)   Hunger Vital Sign  Worried About Programme researcher, broadcasting/film/video in the Last Year: Never true    Ran Out of Food in the Last Year: Never true  Transportation Needs: No Transportation Needs (09/16/2023)   PRAPARE - Administrator, Civil Service (Medical): No    Lack of Transportation (Non-Medical): No  Physical Activity: Sufficiently Active (09/16/2023)   Exercise Vital Sign    Days of Exercise per Week: 4 days    Minutes of Exercise per Session: 60 min  Stress: No Stress Concern Present (09/16/2023)    [employer] of Occupational Health - Occupational Stress Questionnaire    Feeling of Stress : Not at all  Social Connections: Socially Isolated (09/16/2023)   Social Connection and Isolation Panel [NHANES]    Frequency of Communication with Friends and Family: Once a week    Frequency of Social Gatherings with Friends and Family: Once a week    Attends Religious Services: Never    Database administrator or Organizations: No    Attends Engineer, structural: Not on file    Marital Status: Married  Catering manager Violence: Not on file    No past surgical history on file.  Family History  Problem Relation Age of Onset   Hypertension Mother    Alzheimer's disease Mother     Allergies  Allergen Reactions   Penicillins Other (See Comments)    Current Outpatient Medications on File Prior to Visit  Medication Sig Dispense Refill   albuterol (VENTOLIN HFA) 108 (90 Base) MCG/ACT inhaler Inhale 2 puffs into the lungs every 6 (six) hours as needed for wheezing or shortness of breath. 8 g 1   alendronate (FOSAMAX) 70 MG tablet Take 1 tablet (70 mg total) by mouth every 7 (seven) days. Take with a full glass of water on an empty stomach. 12 tablet 3   chlorthalidone (HYGROTON) 25 MG tablet TAKE 1 TABLET BY MOUTH DAILY 30 tablet 3   labetalol (NORMODYNE) 300 MG tablet TAKE 1 TABLET BY MOUTH 2 TIMES A DAY 60 tablet 11   No current facility-administered medications on file prior to visit.    BP 125/70   Pulse 62   Temp 98.2 F (36.8 C)   Resp 18   Ht 5\' 3"  (1.6 m)   Wt 222 lb 9.6 oz (101 kg)   SpO2 95%   BMI 39.43 kg/m         Objective:   Physical Exam  General Mental Status- Alert. General Appearance- Not in acute distress.   Skin General: Color- Normal Color. Moisture- Normal Moisture.  Neck Carotid Arteries- Normal color. Moisture- Normal Moisture. No carotid bruits. No JVD.  Chest and Lung Exam Auscultation: Breath  Sounds:-Normal.  Cardiovascular Auscultation:Rythm- Regular. Murmurs & Other Heart Sounds:Auscultation of the heart reveals- No Murmurs.  Abdomen Inspection:-Inspeection Normal. Palpation/Percussion:Note:No mass. Palpation and Percussion of the abdomen reveal- Non Tender, Non Distended + BS, no rebound or guarding.   Neurologic Cranial Nerve exam:- CN III-XII intact(No nystagmus), symmetric smile. Strength:- 5/5 equal and symmetric strength both upper and lower extremities.       Assessment & Plan:   Assessment and Plan    Hypertension Blood pressure well-controlled with labetalol and chlorthalidone. Home readings 122/80 mmHg, in-office readings elevated due to white coat syndrome. - Continue labetalol 300 mg twice daily. - Continue chlorthalidone 25 mg daily. - Recheck blood pressure at next visit.  Chronic Kidney Disease. Borderline kidney function with creatinine 1.15 mg/dL and GFR 59 ZO/XWR/6.04 m. Discussed  factors affecting kidney function and emphasized hydration. - Order metabolic panel to assess kidney function. - Advise maintaining good hydration. - Avoid chronic NSAID use. - Monitor kidney function regularly.  Hyperlipidemia Previous cholesterol levels slightly elevated. Current lipid panel to assess trends. Initial management includes lifestyle modifications. - Order lipid panel. - Advise low cholesterol diet and regular exercise. - Consider cholesterol medication if lipid levels are elevated.  Prediabetes Mildly elevated blood sugar with previous reading of 103 mg/dL. A1c will assess three-month average glucose levels. - Order A1c test. - Advise low sugar diet and regular exercise.  General Health Maintenance Up to date with vaccinations. Discussed importance of hydration and lifestyle modifications. - Encourage regular exercise and healthy diet. - Maintain adequate hydration.   Follow up date to be determined after lab review.    Esperanza Richters,  PA-C

## 2023-09-17 NOTE — Patient Instructions (Signed)
 Hypertension Blood pressure well-controlled with labetalol and chlorthalidone. Home readings 122/80 mmHg, in-office readings elevated due to white coat syndrome. - Continue labetalol 300 mg twice daily. - Continue chlorthalidone 25 mg daily. - Recheck blood pressure at next visit.  Chronic Kidney Disease. Borderline kidney function with creatinine 1.15 mg/dL and GFR 59 ZO/XWR/6.04 m. Discussed factors affecting kidney function and emphasized hydration. - Order metabolic panel to assess kidney function. - Advise maintaining good hydration. - Avoid chronic NSAID use. - Monitor kidney function regularly.  Hyperlipidemia Previous cholesterol levels slightly elevated. Current lipid panel to assess trends. Initial management includes lifestyle modifications. - Order lipid panel. - Advise low cholesterol diet and regular exercise. - Consider cholesterol medication if lipid levels are elevated.  Prediabetes Mildly elevated blood sugar with previous reading of 103 mg/dL. A1c will assess three-month average glucose levels. - Order A1c test. - Advise low sugar diet and regular exercise.  General Health Maintenance Up to date with vaccinations. Discussed importance of hydration and lifestyle modifications. - Encourage regular exercise and healthy diet. - Maintain adequate hydration.   Follow up date to be determined after lab review.

## 2023-09-18 ENCOUNTER — Encounter: Payer: Self-pay | Admitting: Medical

## 2023-09-18 MED ORDER — ATORVASTATIN CALCIUM 10 MG PO TABS: 10.0000 mg | ORAL_TABLET | Freq: Every day | ORAL | 3 refills | Status: AC

## 2023-09-18 NOTE — Addendum Note (Signed)
 Addended by: Gwenevere Abbot on: 09/18/2023 08:47 AM   Modules accepted: Orders

## 2023-10-28 DIAGNOSIS — G4733 Obstructive sleep apnea (adult) (pediatric): Secondary | ICD-10-CM | POA: Diagnosis not present

## 2023-11-10 ENCOUNTER — Other Ambulatory Visit: Payer: Self-pay | Admitting: Medical

## 2023-12-12 ENCOUNTER — Other Ambulatory Visit: Payer: Self-pay | Admitting: Medical

## 2023-12-22 ENCOUNTER — Other Ambulatory Visit: Payer: Self-pay | Admitting: Medical

## 2023-12-22 DIAGNOSIS — Z1231 Encounter for screening mammogram for malignant neoplasm of breast: Secondary | ICD-10-CM

## 2023-12-31 ENCOUNTER — Ambulatory Visit: Admitting: Medical

## 2024-01-03 ENCOUNTER — Ambulatory Visit (INDEPENDENT_AMBULATORY_CARE_PROVIDER_SITE_OTHER): Admitting: Medical

## 2024-01-03 ENCOUNTER — Encounter: Payer: Self-pay | Admitting: Medical

## 2024-01-03 VITALS — BP 120/70 | HR 60 | Resp 18 | Ht 63.0 in | Wt 217.0 lb

## 2024-01-03 DIAGNOSIS — R739 Hyperglycemia, unspecified: Secondary | ICD-10-CM | POA: Diagnosis not present

## 2024-01-03 DIAGNOSIS — I10 Essential (primary) hypertension: Secondary | ICD-10-CM

## 2024-01-03 DIAGNOSIS — E785 Hyperlipidemia, unspecified: Secondary | ICD-10-CM | POA: Diagnosis not present

## 2024-01-03 DIAGNOSIS — Z1159 Encounter for screening for other viral diseases: Secondary | ICD-10-CM | POA: Diagnosis not present

## 2024-01-03 DIAGNOSIS — R944 Abnormal results of kidney function studies: Secondary | ICD-10-CM | POA: Diagnosis not present

## 2024-01-03 LAB — LIPID PANEL
Cholesterol: 148 mg/dL (ref 0–200)
HDL: 52.4 mg/dL (ref >39.00)
LDL Cholesterol: 78 mg/dL (ref 0–99)
NonHDL: 95.85
Total CHOL/HDL Ratio: 3
Triglycerides: 88 mg/dL (ref 0.0–149.0)
VLDL: 17.6 mg/dL (ref 0.0–40.0)

## 2024-01-03 LAB — HEMOGLOBIN A1C: Hgb A1c MFr Bld: 6.1 % (ref 4.6–6.5)

## 2024-01-03 NOTE — Progress Notes (Addendum)
 Subjective:    Patient ID: Bianca Stephenson, female    DOB: Nov 25, 1955, 68 y.o.   MRN: [id number]  HPI  Melayna Robarts is a 68 year old female with hypertension and hyperlipidemia who presents for follow-up on her lab results.  Her recent lab results showed a total cholesterol of 212 mg/dL and an LDL of 863 mg/dL. She is currently taking atorvastatin  10 mg daily for cholesterol management.  Her metabolic panel indicated slightly elevated blood sugars, with an A1c averaging around 118 mg/dL. She is managing this with a low sugar diet and exercise, although she notes difficulty exercising outdoors due to the heat and humidity, preferring to walk in air-conditioned stores.  Her blood pressure readings have varied, with a recent measurement of 132/80 mmHg after taking her medication. She mentions a previous reading of 115 mmHg  systolic and one instance of 98 mmHg systolic, which she attributes to possible dehydration. She is currently on chlorthalidone  and labetalol  for blood pressure management.  She is fasting today, having skipped breakfast, in preparation for lab work including a metabolic panel, lipid panel, and A1c.       Review of Systems  Constitutional:  Negative for chills, fatigue and fever.  HENT:  Negative for congestion and drooling.   Respiratory:  Negative for cough, chest tightness and wheezing.   Cardiovascular:  Negative for chest pain and palpitations.  Gastrointestinal:  Negative for abdominal pain.  Genitourinary:  Negative for dysuria and flank pain.  Musculoskeletal:  Negative for back pain and joint swelling.  Skin:  Negative for rash.  Neurological:  Negative for dizziness, speech difficulty, weakness, numbness and headaches.  Hematological:  Negative for adenopathy. Does not bruise/bleed easily.  Psychiatric/Behavioral:  Negative for behavioral problems and decreased concentration.     Past Medical History:  Diagnosis Date   Hypertension    Macular  degeneration      Social History   Socioeconomic History   Marital status: Married    Spouse name: Not on file   Number of children: Not on file   Years of education: Not on file   Highest education level: Associate degree: occupational, Scientist, product/process development, or vocational program  Occupational History   Occupation: retired  Tobacco Use   Smoking status: Former    Current packs/day: 0.00    Average packs/day: 1 pack/day for 19.3 years (19.3 ttl pk-yrs)    Types: Cigarettes    Start date: 42    Quit date: 11/01/1996    Years since quitting: 27.1   Smokeless tobacco: Never  Substance and Sexual Activity   Alcohol use: Never   Drug use: Never   Sexual activity: Yes    Birth control/protection: None  Other Topics Concern   Not on file  Social History Narrative   Not on file   Social Drivers of Health   Financial Resource Strain: Low Risk  (12/28/2023)   Overall Financial Resource Strain (CARDIA)    Difficulty of Paying Living Expenses: Not hard at all  Food Insecurity: No Food Insecurity (12/28/2023)   Hunger Vital Sign    Worried About Running Out of Food in the Last Year: Never true    Ran Out of Food in the Last Year: Never true  Transportation Needs: No Transportation Needs (12/28/2023)   PRAPARE - Administrator, Civil Service (Medical): No    Lack of Transportation (Non-Medical): No  Physical Activity: Sufficiently Active (12/28/2023)   Exercise Vital Sign    Days of Exercise  per Week: 4 days    Minutes of Exercise per Session: 40 min  Stress: No Stress Concern Present (12/28/2023)   [employer] of Occupational Health - Occupational Stress Questionnaire    Feeling of Stress: Not at all  Social Connections: Socially Isolated (12/28/2023)   Social Connection and Isolation Panel    Frequency of Communication with Friends and Family: Once a week    Frequency of Social Gatherings with Friends and Family: Once a week    Attends Religious Services: Never    Automotive engineer or Organizations: No    Attends Engineer, structural: Not on file    Marital Status: Married  Catering manager Violence: Not on file    No past surgical history on file.  Family History  Problem Relation Age of Onset   Hypertension Mother    Alzheimer's disease Mother     Allergies  Allergen Reactions   Penicillins Other (See Comments)    Current Outpatient Medications on File Prior to Visit  Medication Sig Dispense Refill   albuterol  (VENTOLIN  HFA) 108 (90 Base) MCG/ACT inhaler Inhale 2 puffs into the lungs every 6 (six) hours as needed for wheezing or shortness of breath. 8 g 1   alendronate  (FOSAMAX ) 70 MG tablet Take 1 tablet (70 mg total) by mouth every 7 (seven) days. Take with a full glass of water on an empty stomach. 12 tablet 3   atorvastatin  (LIPITOR) 10 MG tablet Take 1 tablet (10 mg total) by mouth daily. 90 tablet 3   chlorthalidone  (HYGROTON ) 25 MG tablet TAKE 1 TABLET BY MOUTH DAILY 30 tablet 3   labetalol  (NORMODYNE ) 300 MG tablet TAKE 1 TABLET BY MOUTH 2 TIMES A DAY 60 tablet 11   No current facility-administered medications on file prior to visit.    BP 120/70   Pulse 60   Resp 18   Ht 5' 3 (1.6 m)   Wt 217 lb (98.4 kg)   SpO2 95%   BMI 38.44 kg/m         Objective:   Physical Exam   General Mental Status- Alert. General Appearance- Not in acute distress.   Skin General: Color- Normal Color. Moisture- Normal Moisture.  Neck  No JVD.  Chest and Lung Exam Auscultation: Breath Sounds:-CTA  Cardiovascular Auscultation:Rythm- RRR Murmurs & Other Heart Sounds:Auscultation of the heart reveals- No Murmurs.  Neurologic Cranial Nerve exam:- CN III-XII intact(No nystagmus), symmetric smile. Strength:- 5/5 equal and symmetric strength both upper and lower extremities.      Assessment & Plan:   Patient Instructions  Hypertension Blood pressure controlled with chlorthalidone  and labetalol . Consider adjustment if  consistently <110 mmHg. - Continue chlorthalidone  and labetalol . - Monitor blood pressure regularly. - Consider medication adjustment if blood pressure consistently <110 mmHg.  Hyperlipidemia Total cholesterol 212 mg/dL, LDL 863 mg/dL. Continue atorvastatin  10 mg. - Continue atorvastatin  10 mg daily. -low cholesterol diet  Prediabetes A1c 6.5% indicates prediabetes. Advised low sugar diet and regular exercise indoors. - Advise low sugar diet. - Encourage regular exercise in cool places.  General Health Maintenance Routine screenings and follow-ups planned. - Advise staying hydrated. - Order metabolic panel, lipid panel, and A1c. - Order hepatitis C screening antibody.  Follow up date to be determined after lab review.    Donne Baley, PA-C

## 2024-01-03 NOTE — Patient Instructions (Signed)
 Hypertension Blood pressure controlled with chlorthalidone  and labetalol . Consider adjustment if consistently <110 mmHg. - Continue chlorthalidone  and labetalol . - Monitor blood pressure regularly. - Consider medication adjustment if blood pressure consistently <110 mmHg.  Hyperlipidemia Total cholesterol 212 mg/dL, LDL 863 mg/dL. Continue atorvastatin  10 mg. - Continue atorvastatin  10 mg daily. -low cholesterol diet  Prediabetes A1c 6.5% indicates prediabetes. Advised low sugar diet and regular exercise indoors. - Advise low sugar diet. - Encourage regular exercise in cool places.  General Health Maintenance Routine screenings and follow-ups planned. - Advise staying hydrated. - Order metabolic panel, lipid panel, and A1c. - Order hepatitis C screening antibody.  Follow up date to be determined after lab review.

## 2024-01-04 ENCOUNTER — Ambulatory Visit: Payer: Self-pay | Admitting: Medical

## 2024-01-04 LAB — COMPLETE METABOLIC PANEL WITHOUT GFR
AG Ratio: 1.4 (calc) (ref 1.0–2.5)
ALT: 13 U/L (ref 6–29)
AST: 15 U/L (ref 10–35)
Albumin: 4.4 g/dL (ref 3.6–5.1)
Alkaline phosphatase (APISO): 52 U/L (ref 37–153)
BUN/Creatinine Ratio: 17 (calc) (ref 6–22)
BUN: 19 mg/dL (ref 7–25)
CO2: 32 mmol/L (ref 20–32)
Calcium: 10 mg/dL (ref 8.6–10.4)
Chloride: 95 mmol/L — ABNORMAL LOW (ref 98–110)
Creat: 1.11 mg/dL — ABNORMAL HIGH (ref 0.50–1.05)
Globulin: 3.2 g/dL (ref 1.9–3.7)
Glucose, Bld: 109 mg/dL — ABNORMAL HIGH (ref 65–99)
Potassium: 3.7 mmol/L (ref 3.5–5.3)
Sodium: 139 mmol/L (ref 135–146)
Total Bilirubin: 0.6 mg/dL (ref 0.2–1.2)
Total Protein: 7.6 g/dL (ref 6.1–8.1)

## 2024-01-04 LAB — HEPATITIS C ANTIBODY: Hepatitis C Ab: NONREACTIVE

## 2024-01-13 ENCOUNTER — Ambulatory Visit

## 2024-01-13 DIAGNOSIS — Z1231 Encounter for screening mammogram for malignant neoplasm of breast: Secondary | ICD-10-CM

## 2024-01-18 ENCOUNTER — Ambulatory Visit: Payer: Self-pay | Admitting: Medical

## 2024-03-12 ENCOUNTER — Other Ambulatory Visit: Payer: Self-pay | Admitting: Medical

## 2024-03-27 ENCOUNTER — Other Ambulatory Visit (HOSPITAL_BASED_OUTPATIENT_CLINIC_OR_DEPARTMENT_OTHER): Payer: Self-pay

## 2024-03-27 MED ORDER — COMIRNATY 30 MCG/0.3ML IM SUSY
0.3000 mL | PREFILLED_SYRINGE | Freq: Once | INTRAMUSCULAR | 0 refills | Status: AC
  Filled 2024-03-27: qty 0.3, 1d supply, fill #0

## 2024-03-27 MED ORDER — FLUZONE HIGH-DOSE 0.5 ML IM SUSY
0.5000 mL | PREFILLED_SYRINGE | Freq: Once | INTRAMUSCULAR | 0 refills | Status: AC
  Filled 2024-03-27: qty 0.5, 1d supply, fill #0

## 2024-05-04 NOTE — Progress Notes (Signed)
 [facility] - Clinic Note  05/05/2024   CHIEF COMPLAINT Patient presents for Retina Follow Up  HISTORY OF PRESENT ILLNESS: Bianca Stephenson is a 68 y.o. female who presents to the clinic today for:  HPI     Retina Follow Up   Patient presents with  Other.  In both eyes.  This started 1 year ago.  I, the attending physician,  performed the HPI with the patient and updated documentation appropriately.        Comments   Patient here for 1 year retina follow up for sarcoidosis. Patient states vision OS on the grid starting to see crooked lines. No eye pain.       Last edited by Valdemar Rogue, MD on 05/08/2024 12:27 AM.    Pt states she's doing alright but noticing bumpy lines on her amsler grid in OS.   Referring physician: Kassie Acquanetta Bradley, MD [address] [place],  [place] [address]  HISTORICAL INFORMATION:  Selected notes from the MEDICAL RECORD NUMBER Referred by Dr. Kassie for sarcoidosis LEE:  Ocular Hx- PMH-   CURRENT MEDICATIONS: No current outpatient medications on file. (Ophthalmic Drugs)   No current facility-administered medications for this visit. (Ophthalmic Drugs)   Current Outpatient Medications (Other)  Medication Sig   albuterol  (VENTOLIN  HFA) 108 (90 Base) MCG/ACT inhaler Inhale 2 puffs into the lungs every 6 (six) hours as needed for wheezing or shortness of breath.   alendronate  (FOSAMAX ) 70 MG tablet Take 1 tablet (70 mg total) by mouth every 7 (seven) days. Take with a full glass of water on an empty stomach.   atorvastatin  (LIPITOR) 10 MG tablet Take 1 tablet (10 mg total) by mouth daily.   chlorthalidone  (HYGROTON ) 25 MG tablet TAKE 1 TABLET BY MOUTH DAILY   labetalol  (NORMODYNE ) 300 MG tablet TAKE 1 TABLET BY MOUTH 2 TIMES A DAY   No current facility-administered medications for this visit. (Other)   REVIEW OF SYSTEMS: ROS   Positive for: Cardiovascular, Eyes, Allergic/Imm Last edited by Orval Asberry RAMAN,  COA on 05/05/2024 12:57 PM.     ALLERGIES Allergies  Allergen Reactions   Penicillins Other (See Comments)   PAST MEDICAL HISTORY Past Medical History:  Diagnosis Date   Hypertension    Macular degeneration    History reviewed. No pertinent surgical history. FAMILY HISTORY Family History  Problem Relation Age of Onset   Hypertension Mother    Alzheimer's disease Mother    SOCIAL HISTORY Social History   Tobacco Use   Smoking status: Former    Current packs/day: 0.00    Average packs/day: 1 pack/day for 19.3 years (19.3 ttl pk-yrs)    Types: Cigarettes    Start date: 28    Quit date: 11/01/1996    Years since quitting: 27.5   Smokeless tobacco: Never  Vaping Use   Vaping status: Never Used  Substance Use Topics   Alcohol use: Never   Drug use: Never       OPHTHALMIC EXAM:  Base Eye Exam     Visual Acuity (Snellen - Linear)       Right Left   Dist cc 20/20 20/20 -2    Correction: Glasses         Tonometry (Tonopen, 12:55 PM)       Right Left   Pressure 10 12         Pupils       Dark Light Shape React APD  Right 3 2 Round Brisk None   Left 3 2 Round Brisk None         Visual Fields (Counting fingers)       Left Right    Full Full         Extraocular Movement       Right Left    Full, Ortho Full, Ortho         Neuro/Psych     Oriented x3: Yes   Mood/Affect: Normal         Dilation     Both eyes: 1.0% Mydriacyl, 2.5% Phenylephrine @ 12:55 PM           Slit Lamp and Fundus Exam     Slit Lamp Exam       Right Left   Lids/Lashes Dermatochalasis - upper lid Dermatochalasis - upper lid   Conjunctiva/Sclera White and quiet White and quiet   Cornea Trace tear film debris, 1+ Punctate epithelial erosions Trace tear film debris, 2+ inferior Punctate epithelial erosions   Anterior Chamber deep and clear deep and clear   Iris Round and dilated Round and dilated   Lens 2+ Nuclear sclerosis, 2+ Cortical cataract 2+  Nuclear sclerosis with mild brunescence, 2-3+ Cortical cataract   Anterior Vitreous mild syneresis mild syneresis         Fundus Exam       Right Left   Disc Pink and Sharp Pink and Sharp   C/D Ratio 0.2 0.3   Macula Flat, Blunted foveal reflex, Drusen, RPE mottling and clumping, No heme or edema Flat, Blunted foveal reflex, Drusen, RPE mottling, clumping and early atropy, No heme or edema   Vessels attenuated, Tortuous, no sheathing attenuated, Tortuous, no sheathing   Periphery Attached, reticular degeneration, No heme Attached, reticular degeneration, No heme, peripheral cystoid degeneration, scattered, punctate pigmented CR scars           Refraction     Wearing Rx       Sphere Cylinder Axis Add   Right -1.00 +1.50 017 +0.00   Left -1.50 +1.75 167 +0.00           IMAGING AND PROCEDURES  Imaging and Procedures for 05/05/2024  OCT, Retina - OU - Both Eyes       Right Eye Quality was good. Central Foveal Thickness: 258. Progression has been stable. Findings include normal foveal contour, no IRF, no SRF, retinal drusen , vitreomacular adhesion .   Left Eye Quality was good. Central Foveal Thickness: 263. Progression has been stable. Findings include normal foveal contour, no IRF, no SRF, retinal drusen , epiretinal membrane, outer retinal atrophy, vitreomacular adhesion .   Notes *Images captured and stored on drive  Diagnosis / Impression:  NFP, no IRF/SRF OU +drusen OU  Clinical management:  See below  Abbreviations: NFP - Normal foveal profile. CME - cystoid macular edema. PED - pigment epithelial detachment. IRF - intraretinal fluid. SRF - subretinal fluid. EZ - ellipsoid zone. ERM - epiretinal membrane. ORA - outer retinal atrophy. ORT - outer retinal tubulation. SRHM - subretinal hyper-reflective material. IRHM - intraretinal hyper-reflective material            ASSESSMENT/PLAN:   ICD-10-CM   1. Sarcoidosis  D86.9 OCT, Retina - OU - Both Eyes     2. Intermediate stage nonexudative age-related macular degeneration of both eyes  H35.3132 OCT, Retina - OU - Both Eyes    3. Essential hypertension  I10     4. Hypertensive retinopathy  of both eyes  H35.033     5. Combined forms of age-related cataract of both eyes  H25.813      1. Sarcoidosis - diagnosed by skin biopsy during age 3s - history of breathing issues and previously on prednisone and bronchodilators - denies any history of any ophthalmic or vision issues - exam shows no significant active inflammation in the eyes  OU, but OS with punctate pigmented chorioretinal scars that could represent focal episodes of inflammation in the past - FA 04.30.24 with mild focal perivascular leakage in far temporal periphery OS -- minimal concern for active ocular inflammation OU - repeat FA (10.30.24) shows OD: Mild vascular non-perfusion far periphery; scattered peripheral staining -- mild; no leakage; OS: Mild vascular non perfusion far periphery; focal perivascular leakage temporal periphery-- improved; scattered peripheral staining -- mild; mild late perifoveal staining nasal macula-- improved  -- again no active ocular inflammation OU - no retinal or ophthalmic interventions indicated or recommended   - will monitor  - f/u 9 months, DFE, OCT  2. Age related macular degeneration, non-exudative, both eyes  - intermediate stage with scattered drusen - The incidence, anatomy, and pathology of dry AMD, risk of progression, and the AREDS and AREDS 2 study including smoking risks discussed with patient.  - Recommend amsler grid monitoring  - f/u 9 months months, DFE, OCT  3,4. Hypertensive retinopathy OU - discussed importance of tight BP control - monitor  5. Mixed Cataract OU - The symptoms of cataract, surgical options, and treatments and risks were discussed with patient. - discussed diagnosis and progression - monitor   Ophthalmic Meds Ordered this visit:  No orders of the  defined types were placed in this encounter.    Return in about 9 months (around 02/02/2025) for sarcoidosis, DFE, OCT.  There are no Patient Instructions on file for this visit.  This document serves as a record of services personally performed by Redell JUDITHANN Hans, MD, PhD. It was created on their behalf by Delon Newness COT, an ophthalmic technician. The creation of this record is the provider's dictation and/or activities during the visit.    Electronically signed by: Delon Newness COT 10.30.2025 12:29 AM  This document serves as a record of services personally performed by Redell JUDITHANN Hans, MD, PhD. It was created on their behalf by Almetta Pesa, an ophthalmic technician. The creation of this record is the provider's dictation and/or activities during the visit.    Electronically signed by: Almetta Pesa, OA, 05/08/24  12:29 AM'  Redell JUDITHANN Hans, M.D., Ph.D. Diseases & Surgery of the Retina and Vitreous [facility] 05/05/2024   I have reviewed the above documentation for accuracy and completeness, and I agree with the above. Redell JUDITHANN Hans, M.D., Ph.D. 05/08/24 12:33 AM   Abbreviations: M myopia (nearsighted); A astigmatism; H hyperopia (farsighted); P presbyopia; Mrx spectacle prescription;  CTL contact lenses; OD right eye; OS left eye; OU both eyes  XT exotropia; ET esotropia; PEK punctate epithelial keratitis; PEE punctate epithelial erosions; DES dry eye syndrome; MGD meibomian gland dysfunction; ATs artificial tears; PFAT's preservative free artificial tears; NSC nuclear sclerotic cataract; PSC posterior subcapsular cataract; ERM epi-retinal membrane; PVD posterior vitreous detachment; RD retinal detachment; DM diabetes mellitus; DR diabetic retinopathy; NPDR non-proliferative diabetic retinopathy; PDR proliferative diabetic retinopathy; CSME clinically significant macular edema; DME diabetic macular edema; dbh dot blot hemorrhages; CWS cotton  wool spot; POAG primary open angle glaucoma; C/D cup-to-disc ratio; HVF humphrey visual field; GVF goldmann  visual field; OCT optical coherence tomography; IOP intraocular pressure; BRVO Branch retinal vein occlusion; CRVO central retinal vein occlusion; CRAO central retinal artery occlusion; BRAO branch retinal artery occlusion; RT retinal tear; SB scleral buckle; PPV pars plana vitrectomy; VH Vitreous hemorrhage; PRP panretinal laser photocoagulation; IVK intravitreal kenalog; VMT vitreomacular traction; MH Macular hole;  NVD neovascularization of the disc; NVE neovascularization elsewhere; AREDS age related eye disease study; ARMD age related macular degeneration; POAG primary open angle glaucoma; EBMD epithelial/anterior basement membrane dystrophy; ACIOL anterior chamber intraocular lens; IOL intraocular lens; PCIOL posterior chamber intraocular lens; Phaco/IOL phacoemulsification with intraocular lens placement; PRK photorefractive keratectomy; LASIK laser assisted in situ keratomileusis; HTN hypertension; DM diabetes mellitus; COPD chronic obstructive pulmonary disease

## 2024-05-05 ENCOUNTER — Encounter (INDEPENDENT_AMBULATORY_CARE_PROVIDER_SITE_OTHER): Payer: Self-pay | Admitting: Ophthalmology

## 2024-05-05 ENCOUNTER — Ambulatory Visit (INDEPENDENT_AMBULATORY_CARE_PROVIDER_SITE_OTHER): Payer: Medicare Other | Admitting: Ophthalmology

## 2024-05-05 DIAGNOSIS — H353132 Nonexudative age-related macular degeneration, bilateral, intermediate dry stage: Secondary | ICD-10-CM | POA: Diagnosis not present

## 2024-05-05 DIAGNOSIS — D869 Sarcoidosis, unspecified: Secondary | ICD-10-CM | POA: Diagnosis not present

## 2024-05-05 DIAGNOSIS — I10 Essential (primary) hypertension: Secondary | ICD-10-CM

## 2024-05-05 DIAGNOSIS — H25813 Combined forms of age-related cataract, bilateral: Secondary | ICD-10-CM

## 2024-05-05 DIAGNOSIS — H35033 Hypertensive retinopathy, bilateral: Secondary | ICD-10-CM

## 2024-05-08 ENCOUNTER — Encounter (INDEPENDENT_AMBULATORY_CARE_PROVIDER_SITE_OTHER): Payer: Self-pay | Admitting: Ophthalmology

## 2024-05-31 ENCOUNTER — Telehealth (HOSPITAL_BASED_OUTPATIENT_CLINIC_OR_DEPARTMENT_OTHER): Payer: Self-pay

## 2024-05-31 ENCOUNTER — Encounter (HOSPITAL_BASED_OUTPATIENT_CLINIC_OR_DEPARTMENT_OTHER): Payer: Self-pay | Admitting: Pulmonary Disease

## 2024-05-31 ENCOUNTER — Ambulatory Visit (HOSPITAL_BASED_OUTPATIENT_CLINIC_OR_DEPARTMENT_OTHER): Admitting: Pulmonary Disease

## 2024-05-31 VITALS — BP 139/64 | HR 65 | Ht 63.0 in | Wt 216.9 lb

## 2024-05-31 DIAGNOSIS — R0602 Shortness of breath: Secondary | ICD-10-CM | POA: Diagnosis not present

## 2024-05-31 DIAGNOSIS — G4733 Obstructive sleep apnea (adult) (pediatric): Secondary | ICD-10-CM | POA: Diagnosis not present

## 2024-05-31 NOTE — Telephone Encounter (Signed)
 CMN received for CPAP supplies sent to [facility] signed by provider and faxed confirmation received

## 2024-05-31 NOTE — Progress Notes (Signed)
 Subjective:   PATIENT ID: Bianca Stephenson: female DOB: [birth year], MRN: [id number]   HPI  Chief Complaint  Patient presents with   Sarcoidosis    OSA compliance   Reason for Visit: Follow-up for sarcoidosis  [name] is a 68 year old with sarcoidosis and hypertension who presents for follow-up.  Synopsis: She was diagnosed skin biopsy 30 years ago. She was referred to Pulmonology in 1998 and had PFTs and underwent bronchoscopy five times. She was on prednisone and bronchodilators for period of time. She reports her symptoms at the time were shortness of breath with rare wheezing during exertion.  2022 - Established care at [facility]. Minimal respiratory symptoms with DOE. Normal PFTs except for borderline mildly reduced DLCO 2024 - Lost to follow and returned in March. Wheezing with exertion. On Advair  Diskus 100-50. Started CPAP in June. 2025 - Compliant with CPAP. Wheezing   09/15/23 Since our last visit she is overall doing well respiratory wise. Has been compliant CPAP and has noticed blood pressure improved. Complains of dry mouth but already using humidifier. Compliant with Advair  but no difference with increased strength with occasional wheezing.   05/31/24 Since our last visit she has been compliant with CPAP. Has  had dry mouth but using over the counter saline sprays with some relief. She has shortness of breath with activity including walking. Trying to walk Monday to Friday for 2000-3000 steps daily. Denies wheezing.   Social History: Retired magazine features editor  Past Medical History:  Diagnosis Date   Hypertension    Macular degeneration      Allergies  Allergen Reactions   Penicillins Other (See Comments)     Outpatient Medications Prior to Visit  Medication Sig Dispense Refill   albuterol  (VENTOLIN  HFA) 108 (90 Base) MCG/ACT inhaler Inhale 2 puffs into the lungs every 6 (six) hours as needed for wheezing or shortness of breath. 8 g 1    atorvastatin  (LIPITOR) 10 MG tablet Take 1 tablet (10 mg total) by mouth daily. 90 tablet 3   chlorthalidone  (HYGROTON ) 25 MG tablet TAKE 1 TABLET BY MOUTH DAILY 90 tablet 1   alendronate  (FOSAMAX ) 70 MG tablet Take 1 tablet (70 mg total) by mouth every 7 (seven) days. Take with a full glass of water on an empty stomach. (Patient not taking: Reported on 05/31/2024) 12 tablet 3   labetalol  (NORMODYNE ) 300 MG tablet TAKE 1 TABLET BY MOUTH 2 TIMES A DAY 60 tablet 11   No facility-administered medications prior to visit.    Review of Systems  Constitutional:  Negative for chills, diaphoresis, fever, malaise/fatigue and weight loss.  HENT:  Negative for congestion.   Respiratory:  Positive for shortness of breath. Negative for cough, hemoptysis, sputum production and wheezing.   Cardiovascular:  Negative for chest pain, palpitations and leg swelling.     Objective:   Vitals:   05/31/24 0842  BP: 139/64  Pulse: 65  SpO2: 95%  Weight: 216 lb 14.4 oz (98.4 kg)  Height: 5' 3 (1.6 m)   SpO2: 95 % Physical Exam: General: Well-appearing, no acute distress HENT: Crucible, AT Eyes: EOMI, no scleral icterus Respiratory: Clear to auscultation bilaterally.  No crackles, wheezing or rales Cardiovascular: RRR, -M/R/G, no JVD Extremities:-Edema,-tenderness Neuro: AAO x4, CNII-XII grossly intact Psych: Normal mood, normal affect   Data Reviewed:  Imaging: CXR 11/01/20 - Chronic interstitial lung changes  PFT: 04/03/21 FVC 2.42 (78%) FEV1 1.83 (77%) Ratio 73  TLC 88% DLCO 79% Interpretation:  No evidence of obstructive or restrictive defect. F-V loops suggest small airway obstruction. Corrected DLCO with minimal reduction  10/09/22 FVC 2.32 (77%) FEV1 1.78 (77%) Ratio 77  TLC 84% DLCO 84% Interpretation: No obstructive or restrictive defect. Normal DLCO  Labs: CBC    Component Value Date/Time   WBC 7.0 07/28/2022 1042   RBC 4.01 07/28/2022 1042   HGB 12.0 07/28/2022 1042   HCT 35.6 (L)  07/28/2022 1042   PLT 265.0 07/28/2022 1042   MCV 89.0 07/28/2022 1042   MCHC 33.7 07/28/2022 1042   RDW 14.7 07/28/2022 1042   LYMPHSABS 1.5 07/28/2022 1042   MONOABS 0.6 07/28/2022 1042   EOSABS 0.1 07/28/2022 1042   BASOSABS 0.0 07/28/2022 1042      Latest Ref Rng & Units 01/03/2024   11:16 AM 09/17/2023   11:02 AM 01/22/2023   10:06 AM  CMP  Glucose 65 - 99 mg/dL 890  894  896   BUN 7 - 25 mg/dL 19  20  15    Creatinine 0.50 - 1.05 mg/dL 8.88  8.87  8.84   Sodium 135 - 146 mmol/L 139  133  135   Potassium 3.5 - 5.3 mmol/L 3.7  3.8  3.6   Chloride 98 - 110 mmol/L 95  94  97   CO2 20 - 32 mmol/L 32  30  29   Calcium  8.6 - 10.4 mg/dL 89.9  9.8  9.3   Total Protein 6.1 - 8.1 g/dL 7.6  7.5  7.0   Total Bilirubin 0.2 - 1.2 mg/dL 0.6  0.6  0.5   Alkaline Phos 39 - 117 U/L  47    AST 10 - 35 U/L 15  17  13    ALT 6 - 29 U/L 13  12  9        Latest Ref Rng & Units 01/21/2021    2:17 PM  Quantiferon TB Gold  Quantiferon TB Gold Plus NEGATIVE NEGATIVE    EKG 01/21/21 - Sinus bradycardia. Normal PR and Qtc interval.    Compliance Report 04/01/24-05/30/24 Usage days 54/60 days (90%) >4hours 54 days (90%) Avg usage 6 hours 25 min Auto 5-20 cm H20 AHI 2.2 Median pressure 7.5, Max 14.4 cm H20  Assessment & Plan:   Discussion: 68 year old female with sarcoidosis involving lung and skin, HTN, OSA who presents for OSA follow-up.   OSA --Patient uses NIV for more than four hours nightly for at least 70% of nights during the last three months of usage. The patient has been using and benefiting from PAP use and will continue to benefit from therapy.  --Counseled on sleep hygiene --Counseled on weight loss/maintenance of healthy weight --Counseled NOT to drive if/when sleepy --Advised patient to wear CPAP for at least 4 hours each night for greater than 70% of the time to avoid the machine being repossessed by insurance.  Shortness of breath - deconditioning Chronic bronchitis with  wheezing - improved PFTs normal.  - Not on inhalers due to ineffectiveness - Discussed pulmonary rehab. 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 Previously on steroids. - No indication for prednisone therapy - Annual PFTs.  Last 10/09/22.  - F/u with ophthalmology annually. 10/2022 - no active disease - EKG reviewed. No evidence of conduction abnormalities on 01/2021 EKG  Health Maintenance Immunization History  Administered Date(s) Administered   Fluad   Trivalent(High Dose 65+) 03/25/2023   INFLUENZA, HIGH DOSE SEASONAL PF 03/27/2024   Influenza-Unspecified 05/01/2021, 05/01/2022   PFIZER(Purple Top)SARS-COV-2 Vaccination 09/21/2019, 10/13/2019, 06/05/2020   PNEUMOCOCCAL CONJUGATE-20 07/28/2022   Pfizer(Comirnaty )Fall Seasonal Vaccine 12 years and older 10/16/2022, 03/25/2023, 03/27/2024   Respiratory Syncytial Virus Vaccine,Recomb Aduvanted(Arexvy) 05/01/2022   Tdap 05/13/2021   Zoster Recombinant(Shingrix) 08/06/2022, 11/26/2022   CT Lung Screen - not indicated  No orders of the defined types were placed in this encounter.  No orders of the defined types were placed in this encounter.  Return in about 6 months (around 11/28/2024).  I have spent a total time of 30-minutes on the day of the appointment including chart review, data review, collecting history, coordinating care and discussing medical diagnosis and plan with the patient/family. Past medical history, allergies, medications were reviewed. Pertinent imaging, labs and tests included in this note have been reviewed and interpreted independently by me.    Bartlett Enke Slater Staff, MD [facility] 05/31/2024 8:59 AM

## 2024-05-31 NOTE — Patient Instructions (Addendum)
 OSA --Patient uses NIV for more than four hours nightly for at least 70% of nights during the last three months of usage. The patient has been using and benefiting from PAP use and will continue to benefit from therapy.  --Counseled on sleep hygiene --Counseled on weight loss/maintenance of healthy weight --Counseled NOT to drive if/when sleepy --Advised patient to wear CPAP for at least 4 hours each night for greater than 70% of the time to avoid the machine being repossessed by insurance.  Shortness of breath - deconditioning Chronic bronchitis with wheezing - improved PFTs normal.  - Not on inhalers due to ineffectiveness - Discussed pulmonary rehab. Declined due to concern for financial constraints - Encourage to walk 5000 steps a day

## 2024-06-12 ENCOUNTER — Ambulatory Visit: Admitting: *Deleted

## 2024-06-12 VITALS — Ht 63.0 in | Wt 217.0 lb

## 2024-06-12 DIAGNOSIS — M8580 Other specified disorders of bone density and structure, unspecified site: Secondary | ICD-10-CM

## 2024-06-12 DIAGNOSIS — Z0000 Encounter for general adult medical examination without abnormal findings: Secondary | ICD-10-CM

## 2024-06-12 DIAGNOSIS — Z780 Asymptomatic menopausal state: Secondary | ICD-10-CM

## 2024-06-12 NOTE — Progress Notes (Signed)
 Chief Complaint  Patient presents with   Medicare Wellness     Subjective:   Bianca Stephenson is a 68 y.o. female who presents for a Medicare Annual Wellness Visit.  Visit info / Clinical Intake: Medicare Wellness Visit Type:: Initial Annual Wellness Visit Persons participating in visit and providing information:: patient Medicare Wellness Visit Mode:: Telephone If telephone:: video declined Since this visit was completed virtually, some vitals may be partially provided or unavailable. Missing vitals are due to the limitations of the virtual format.: Unable to obtain vitals - no equipment If Telephone or Video please confirm:: I connected with patient using audio/video enable telemedicine. I verified patient identity with two identifiers, discussed telehealth limitations, and patient agreed to proceed. Patient Location:: home Provider Location:: office Interpreter Needed?: No Pre-visit prep was completed: yes AWV questionnaire completed by patient prior to visit?: yes Date:: 06/08/24 Living arrangements:: (Patient-Rptd) lives with spouse/significant other Patient's Overall Health Status Rating: (Patient-Rptd) good Typical amount of pain: (Patient-Rptd) none Does pain affect daily life?: (Patient-Rptd) no Are you currently prescribed opioids?: no  Dietary Habits and Nutritional Risks How many meals a day?: (Patient-Rptd) 2 Eats fruit and vegetables daily?: (!) (Patient-Rptd) no Most meals are obtained by: (Patient-Rptd) preparing own meals In the last 2 weeks, have you had any of the following?: none Diabetic:: no  Functional Status Activities of Daily Living (to include ambulation/medication): (Patient-Rptd) Independent Ambulation: (Patient-Rptd) Independent Medication Administration: (Patient-Rptd) Independent Home Management (perform basic housework or laundry): (Patient-Rptd) Independent Manage your own finances?: (Patient-Rptd) yes Primary transportation is: (Patient-Rptd)  driving Concerns about vision?: no *vision screening is required for WTM* (Sees Dr Valdemar and up to date) Concerns about hearing?: no  Fall Screening Falls in the past year?: (Patient-Rptd) 0 Number of falls in past year: 0 Was there an injury with Fall?: 0 Fall Risk Category Calculator: 0 Patient Fall Risk Level: Low Fall Risk  Fall Risk Patient at Risk for Falls Due to: History of fall(s) Fall risk Follow up: Falls evaluation completed  Home and Transportation Safety: All rugs have non-skid backing?: (Patient-Rptd) N/A, no rugs All stairs or steps have railings?: (Patient-Rptd) N/A, no stairs Grab bars in the bathtub or shower?: (Patient-Rptd) yes Have non-skid surface in bathtub or shower?: (Patient-Rptd) yes Good home lighting?: (Patient-Rptd) yes Regular seat belt use?: (Patient-Rptd) yes Hospital stays in the last year:: (Patient-Rptd) no  Cognitive Assessment Difficulty concentrating, remembering, or making decisions? : (Patient-Rptd) no Will 6CIT or Mini Cog be Completed: yes What year is it?: 0 points What month is it?: 0 points Give patient an address phrase to remember (5 components): [address], [place]  About what time is it?: 0 points Count backwards from 20 to 1: 0 points Say the months of the year in reverse: 0 points Repeat the address phrase from earlier: 0 points 6 CIT Score: 0 points  Advance Directives (For Healthcare) Does Patient Have a Medical Advance Directive?: No Would patient like information on creating a medical advance directive?: No - Patient declined  Reviewed/Updated  Reviewed/Updated: Reviewed All (Medical, Surgical, Family, Medications, Allergies, Care Teams, Patient Goals)    Allergies (verified) Penicillins   Current Medications (verified) Outpatient Encounter Medications as of 06/12/2024  Medication Sig   albuterol  (VENTOLIN  HFA) 108 (90 Base) MCG/ACT inhaler Inhale 2 puffs into the lungs every 6 (six) hours as needed  for wheezing or shortness of breath.   alendronate  (FOSAMAX ) 70 MG tablet Take 1 tablet (70 mg total) by mouth every 7 (seven) days.  Take with a full glass of water on an empty stomach.   atorvastatin  (LIPITOR) 10 MG tablet Take 1 tablet (10 mg total) by mouth daily.   chlorthalidone  (HYGROTON ) 25 MG tablet TAKE 1 TABLET BY MOUTH DAILY   labetalol  (NORMODYNE ) 300 MG tablet TAKE 1 TABLET BY MOUTH 2 TIMES A DAY   No facility-administered encounter medications on file as of 06/12/2024.    History: Past Medical History:  Diagnosis Date   Hypertension    Macular degeneration    History reviewed. No pertinent surgical history. Family History  Problem Relation Age of Onset   Hypertension Mother    Alzheimer's disease Mother    Social History   Occupational History   Occupation: retired  Tobacco Use   Smoking status: Former    Current packs/day: 0.00    Average packs/day: 1 pack/day for 19.3 years (19.3 ttl pk-yrs)    Types: Cigarettes    Start date: 67    Quit date: 11/01/1996    Years since quitting: 27.6   Smokeless tobacco: Never  Vaping Use   Vaping status: Never Used  Substance and Sexual Activity   Alcohol use: Never   Drug use: Never   Sexual activity: Yes    Birth control/protection: None   Tobacco Counseling Counseling given: Not Answered  SDOH Screenings   Food Insecurity: No Food Insecurity (06/08/2024)  Housing: Low Risk  (06/08/2024)  Transportation Needs: No Transportation Needs (06/08/2024)  Utilities: Not At Risk (06/12/2024)  Alcohol Screen: Low Risk  (09/16/2023)  Depression (PHQ2-9): Low Risk  (06/12/2024)  Financial Resource Strain: Low Risk  (06/08/2024)  Physical Activity: Sufficiently Active (06/08/2024)  Social Connections: Moderately Isolated (06/08/2024)  Stress: No Stress Concern Present (06/08/2024)  Tobacco Use: Medium Risk (06/12/2024)   See flowsheets for full screening details  Depression Screen PHQ 2 & 9 Depression Scale- Over the past 2  weeks, how often have you been bothered by any of the following problems? Little interest or pleasure in doing things: 0 Feeling down, depressed, or hopeless (PHQ Adolescent also includes...irritable): 0 PHQ-2 Total Score: 0 Trouble falling or staying asleep, or sleeping too much: 0 Feeling tired or having little energy: 1 Poor appetite or overeating (PHQ Adolescent also includes...weight loss): 0 Feeling bad about yourself - or that you are a failure or have let yourself or your family down: 0 Trouble concentrating on things, such as reading the newspaper or watching television (PHQ Adolescent also includes...like school work): 0 Moving or speaking so slowly that other people could have noticed. Or the opposite - being so fidgety or restless that you have been moving around a lot more than usual: 0 Thoughts that you would be better off dead, or of hurting yourself in some way: 0 PHQ-9 Total Score: 1 If you checked off any problems, how difficult have these problems made it for you to do your work, take care of things at home, or get along with other people?: Not difficult at all  Depression Treatment Depression Interventions/Treatment : EYV7-0 Score <4 Follow-up Not Indicated     Goals Addressed             This Visit's Progress    to walk 5,000 steps a day               Objective:    Today's Vitals   06/12/24 0902  Weight: 217 lb (98.4 kg)  Height: 5' 3 (1.6 m)   Body mass index is 38.44 kg/m.  Hearing/Vision screen  No results found. Immunizations and Health Maintenance Health Maintenance  Topic Date Due   COVID-19 Vaccine (7 - 2025-26 season) 09/24/2024   Medicare Annual Wellness (AWV)  06/12/2025   Mammogram  01/12/2026   Colonoscopy  01/13/2027   DTaP/Tdap/Td (2 - Td or Tdap) 05/14/2031   Pneumococcal Vaccine: 50+ Years  Completed   Influenza Vaccine  Completed   Bone Density Scan  Completed   Hepatitis C Screening  Completed   Zoster Vaccines- Shingrix   Completed   Meningococcal B Vaccine  Aged Out        Assessment/Plan:  This is a routine wellness examination for Bianca Stephenson.  Patient Care Team: Saguier, Edward, PA-C as PCP - General (Internal Medicine) Valdemar Rogue, MD as Consulting Physician (Ophthalmology) Kassie Acquanetta Bradley, MD as Consulting Physician (Pulmonary Disease)  I have personally reviewed and noted the following in the patient's chart:   Medical and social history Use of alcohol, tobacco or illicit drugs  Current medications and supplements including opioid prescriptions. Functional ability and status Nutritional status Physical activity Advanced directives List of other physicians Hospitalizations, surgeries, and ER visits in previous 12 months Vitals Screenings to include cognitive, depression, and falls Referrals and appointments  Orders Placed This Encounter  Procedures   DG Bone Density    Standing Status:   Future    Expected Date:   08/28/2024    Expiration Date:   06/12/2025    Reason for Exam (SYMPTOM  OR DIAGNOSIS REQUIRED):   postmenopausal estrogen deficiency, osteopenia    Preferred imaging location?:   [facility]   In addition, I have reviewed and discussed with patient certain preventive protocols, quality metrics, and best practice recommendations. A written personalized care plan for preventive services as well as general preventive health recommendations were provided to patient.   Lolita Libra, CMA   06/12/2024   Return in 1 year (on 06/12/2025).  After Visit Summary: (MyChart) Due to this being a telephonic visit, the after visit summary with patients personalized plan was offered to patient via MyChart   Nurse Notes: nothing significant to report

## 2024-06-12 NOTE — Patient Instructions (Addendum)
 [name],  Thank you for taking the time for your Medicare Wellness Visit. I appreciate your continued commitment to your health goals. Please review the care plan we discussed, and feel free to reach out if I can assist you further.  Please note that Annual Wellness Visits do not include a physical exam. Some assessments may be limited, especially if the visit was conducted virtually. If needed, we may recommend an in-person follow-up with your provider.  GOAL:  To walk 5,000 steps a day  Ongoing Care Seeing your primary care provider every 3 to 6 months helps us  monitor your health and provide consistent, personalized care.   Dallas Maxwell, PA-C:  07/14/24 9am, fasting Medicare AWV:  06/13/25 8:20am, telephone  Referrals If a referral was made during today's visit and you haven't received any updates within two weeks, please contact the referred provider directly to check on the status.  Bone Density ([facility]) due 08/28/24:  [phone]  Recommended Screenings:  Health Maintenance  Topic Date Due   COVID-19 Vaccine (7 - 2025-26 season) 09/24/2024   Medicare Annual Wellness Visit  06/12/2025   Breast Cancer Screening  01/12/2026   Colon Cancer Screening  01/13/2027   DTaP/Tdap/Td vaccine (2 - Td or Tdap) 05/14/2031   Pneumococcal Vaccine for age over 54  Completed   Flu Shot  Completed   Osteoporosis screening with Bone Density Scan  Completed   Hepatitis C Screening  Completed   Zoster (Shingles) Vaccine  Completed   Meningitis B Vaccine  Aged Out       06/08/2024    7:49 AM  Advanced Directives  Does Patient Have a Medical Advance Directive? No  Would patient like information on creating a medical advance directive? No - Patient declined  Please let me know if you do not receive your Advanced Directive Packet within 1 week. Once completed and notarized, you may return a copy of your Advanced Directive(s) by either of the following:  Bring a copy of your health  care power of attorney and living will to the office to be added to your chart at your convenience. You can mail a copy to [facility] [address] [place], [place] [address] or email to [email or web address]   Vision: Annual vision screenings are recommended for early detection of glaucoma, cataracts, and diabetic retinopathy. These exams can also reveal signs of chronic conditions such as diabetes and high blood pressure.  Dental: Annual dental screenings help detect early signs of oral cancer, gum disease, and other conditions linked to overall health, including heart disease and diabetes.  Please see the attached documents for additional preventive care recommendations.

## 2024-07-12 ENCOUNTER — Encounter: Payer: Self-pay | Admitting: Medical

## 2024-07-12 ENCOUNTER — Ambulatory Visit: Admitting: Medical

## 2024-07-12 VITALS — BP 136/82 | HR 54 | Temp 98.1°F | Ht 63.0 in | Wt 215.8 lb

## 2024-07-12 DIAGNOSIS — E785 Hyperlipidemia, unspecified: Secondary | ICD-10-CM

## 2024-07-12 DIAGNOSIS — R944 Abnormal results of kidney function studies: Secondary | ICD-10-CM | POA: Diagnosis not present

## 2024-07-12 DIAGNOSIS — I10 Essential (primary) hypertension: Secondary | ICD-10-CM

## 2024-07-12 DIAGNOSIS — Z1211 Encounter for screening for malignant neoplasm of colon: Secondary | ICD-10-CM

## 2024-07-12 DIAGNOSIS — M8580 Other specified disorders of bone density and structure, unspecified site: Secondary | ICD-10-CM | POA: Diagnosis not present

## 2024-07-12 DIAGNOSIS — R739 Hyperglycemia, unspecified: Secondary | ICD-10-CM

## 2024-07-12 LAB — LIPID PANEL
Cholesterol: 126 mg/dL (ref 28–200)
HDL: 52.5 mg/dL
LDL Cholesterol: 60 mg/dL (ref 10–99)
NonHDL: 73.78
Total CHOL/HDL Ratio: 2
Triglycerides: 69 mg/dL (ref 10.0–149.0)
VLDL: 13.8 mg/dL (ref 0.0–40.0)

## 2024-07-12 LAB — COMPREHENSIVE METABOLIC PANEL WITH GFR
ALT: 14 U/L (ref 3–35)
AST: 17 U/L (ref 5–37)
Albumin: 4.3 g/dL (ref 3.5–5.2)
Alkaline Phosphatase: 45 U/L (ref 39–117)
BUN: 16 mg/dL (ref 6–23)
CO2: 32 meq/L (ref 19–32)
Calcium: 9.3 mg/dL (ref 8.4–10.5)
Chloride: 97 meq/L (ref 96–112)
Creatinine, Ser: 1.12 mg/dL (ref 0.40–1.20)
GFR: 50.75 mL/min — ABNORMAL LOW
Glucose, Bld: 97 mg/dL (ref 70–99)
Potassium: 3.7 meq/L (ref 3.5–5.1)
Sodium: 136 meq/L (ref 135–145)
Total Bilirubin: 0.6 mg/dL (ref 0.2–1.2)
Total Protein: 7.1 g/dL (ref 6.0–8.3)

## 2024-07-12 LAB — HEMOGLOBIN A1C: Hgb A1c MFr Bld: 6.1 % (ref 4.6–6.5)

## 2024-07-12 NOTE — Patient Instructions (Addendum)
 Essential hypertension Blood pressure controlled at 136/82 mmHg with current regimen. - Continue chlorthalidone  25 mg once daily. - Continue labetalol  300 mg twice daily.  Hyperlipidemia Managed with atorvastatin  10 mg daily. Constipation not common; dietary factors may influence bowel habits. BMs improved with high fiber foods recently. - Continue atorvastatin  10 mg daily. - Encouraged high fiber diet with vegetables.  Osteopenia Managed with alendronate . DEXA scan ordered to assess bone density. - Continue alendronate  (Fosamax ) once weekly. - Proceed with DEXA scan as ordered.  Impaired fasting glucose Blood glucose levels range from 91 to 123 mg/dL. Discussed dietary impact on glucose levels, emphasizing fiber intake. - Ordered A1c and metabolic panel. - Advised monitoring blood glucose levels fasting and postprandial.  General Health Maintenance Colonoscopy last performed in 2018 in [place] with recommendation for repeat in five years. GI referral made for pathology reports and scheduling. - Followed up with GI referral for colonoscopy scheduling.  Follow up to be determined after lab review

## 2024-07-12 NOTE — Progress Notes (Signed)
 "  Subjective:    Patient ID: Bianca Stephenson, female    DOB: 1955/12/12, 69 y.o.   MRN: [id number]  HPI  [name] is a 69 year old female who presents for a follow-up visit.  She is taking chlorthalidone  25 mg daily and labetalol  300 mg twice daily for hypertension.  For hyperlipidemia, she takes atorvastatin  10 mg daily. She has constipation with bowel movements about every other day, which bms mproved with  daily with increased vegetables.  She had a colonoscopy in 2018 and is due for repeat in five years.(Pt tells me today was told that when in [place]) on review of records in media I do not see that old report)  She has osteopenia and takes alendronate  (Fosamax ) one tablet weekly. She has not yet scheduled a DEXA scan.  She checks blood glucose at home, with nighttime readings up to 123-124 mg/dL and a recent value of 91 mg/dL.    Review of Systems  Constitutional:  Negative for chills, fatigue and fever.  HENT:  Negative for congestion and drooling.   Respiratory:  Negative for cough, chest tightness and wheezing.   Cardiovascular:  Negative for chest pain and palpitations.  Gastrointestinal:  Negative for abdominal pain.  Genitourinary:  Negative for dysuria and flank pain.  Musculoskeletal:  Negative for back pain and joint swelling.  Skin:  Negative for rash.  Neurological:  Negative for dizziness, speech difficulty, weakness, numbness and headaches.  Hematological:  Negative for adenopathy. Does not bruise/bleed easily.  Psychiatric/Behavioral:  Negative for behavioral problems and decreased concentration.     Past Medical History:  Diagnosis Date   Hypertension    Macular degeneration      Social History   Socioeconomic History   Marital status: Married    Spouse name: Not on file   Number of children: Not on file   Years of education: Not on file   Highest education level: Associate degree: occupational, scientist, product/process development, or vocational program  Occupational History    Occupation: retired  Tobacco Use   Smoking status: Former    Current packs/day: 0.00    Average packs/day: 1 pack/day for 19.3 years (19.3 ttl pk-yrs)    Types: Cigarettes    Start date: 57    Quit date: 11/01/1996    Years since quitting: 27.7   Smokeless tobacco: Never  Vaping Use   Vaping status: Never Used  Substance and Sexual Activity   Alcohol use: Never   Drug use: Never   Sexual activity: Yes    Birth control/protection: None  Other Topics Concern   Not on file  Social History Narrative   Not on file   Social Drivers of Health   Tobacco Use: Medium Risk (07/12/2024)   Patient History    Smoking Tobacco Use: Former    Smokeless Tobacco Use: Never    Passive Exposure: Not on Actuary Strain: Low Risk (06/08/2024)   Overall Financial Resource Strain (CARDIA)    Difficulty of Paying Living Expenses: Not hard at all  Food Insecurity: No Food Insecurity (06/08/2024)   Epic    Worried About Radiation Protection Practitioner of Food in the Last Year: Never true    Ran Out of Food in the Last Year: Never true  Transportation Needs: No Transportation Needs (06/08/2024)   Epic    Lack of Transportation (Medical): No    Lack of Transportation (Non-Medical): No  Physical Activity: Sufficiently Active (06/08/2024)   Exercise Vital Sign    Days  of Exercise per Week: 4 days    Minutes of Exercise per Session: 60 min  Stress: No Stress Concern Present (06/08/2024)   [employer] of Occupational Health - Occupational Stress Questionnaire    Feeling of Stress: Not at all  Social Connections: Moderately Isolated (06/08/2024)   Social Connection and Isolation Panel    Frequency of Communication with Friends and Family: Three times a week    Frequency of Social Gatherings with Friends and Family: Three times a week    Attends Religious Services: Never    Active Member of Clubs or Organizations: No    Attends Banker Meetings: Not on file    Marital Status: Married   Catering Manager Violence: Not on file  Depression (PHQ2-9): Low Risk (06/12/2024)   Depression (PHQ2-9)    PHQ-2 Score: 1  Alcohol Screen: Low Risk (09/16/2023)   Alcohol Screen    Last Alcohol Screening Score (AUDIT): 0  Housing: Low Risk (06/08/2024)   Epic    Unable to Pay for Housing in the Last Year: No    Number of Times Moved in the Last Year: 0    Homeless in the Last Year: No  Utilities: Not At Risk (06/12/2024)   Epic    Threatened with loss of utilities: No  Health Literacy: Not on file    No past surgical history on file.  Family History  Problem Relation Age of Onset   Hypertension Mother    Alzheimer's disease Mother     Allergies[1]  Medications Ordered Prior to Encounter[2]  BP 136/82 (BP Location: Right Arm, Patient Position: Sitting)   Pulse (!) 54   Temp 98.1 F (36.7 C) (Oral)   Ht 5' 3 (1.6 m)   Wt 215 lb 12.8 oz (97.9 kg)   SpO2 96%   BMI 38.23 kg/m        Objective:   Physical Exam  General- No acute distress. Pleasant patient. Neck- Full range of motion, no jvd Lungs- Clear, even and unlabored. Heart- regular rate and rhythm. Neurologic- CNII- XII grossly intact.       Assessment & Plan:   Essential hypertension Blood pressure controlled at 136/82 mmHg with current regimen. - Continue chlorthalidone  25 mg once daily. - Continue labetalol  300 mg twice daily.  Hyperlipidemia Managed with atorvastatin  10 mg daily. Constipation not common; dietary factors may influence bowel habits. BMs improved with high fiber foods recently. - Continue atorvastatin  10 mg daily. - Encouraged high fiber diet with vegetables.  Osteopenia Managed with alendronate . DEXA scan ordered to assess bone density. - Continue alendronate  (Fosamax ) once weekly. - Proceed with DEXA scan as ordered.  Impaired fasting glucose Blood glucose levels range from 91 to 123 mg/dL. Discussed dietary impact on glucose levels, emphasizing fiber intake. - Ordered A1c  and metabolic panel. - Advised monitoring blood glucose levels fasting and postprandial.  General Health Maintenance Colonoscopy last performed in 2018 in [place] with recommendation for repeat in five years. GI referral made for pathology reports and scheduling. - Followed up with GI referral for colonoscopy scheduling.   Follow up to be determined after lab review  Dallas Maxwell, PA-C     [1]  Allergies Allergen Reactions   Penicillins Other (See Comments)  [2]  Current Outpatient Medications on File Prior to Visit  Medication Sig Dispense Refill   alendronate  (FOSAMAX ) 70 MG tablet Take 1 tablet (70 mg total) by mouth every 7 (seven) days. Take with a full glass of water on an  empty stomach. 12 tablet 3   atorvastatin  (LIPITOR) 10 MG tablet Take 1 tablet (10 mg total) by mouth daily. 90 tablet 3   chlorthalidone  (HYGROTON ) 25 MG tablet TAKE 1 TABLET BY MOUTH DAILY 90 tablet 1   labetalol  (NORMODYNE ) 300 MG tablet TAKE 1 TABLET BY MOUTH 2 TIMES A DAY 60 tablet 11   albuterol  (VENTOLIN  HFA) 108 (90 Base) MCG/ACT inhaler Inhale 2 puffs into the lungs every 6 (six) hours as needed for wheezing or shortness of breath. 8 g 1   No current facility-administered medications on file prior to visit.   "

## 2024-07-13 ENCOUNTER — Ambulatory Visit: Payer: Self-pay | Admitting: Medical

## 2024-08-03 ENCOUNTER — Ambulatory Visit: Payer: Self-pay | Admitting: Medical

## 2024-08-03 ENCOUNTER — Ambulatory Visit (HOSPITAL_BASED_OUTPATIENT_CLINIC_OR_DEPARTMENT_OTHER)
Admission: RE | Admit: 2024-08-03 | Discharge: 2024-08-03 | Disposition: A | Discharge status: Home / Self Care | Source: Ambulatory Visit | Attending: Medical | Admitting: Medical

## 2024-08-03 DIAGNOSIS — M8580 Other specified disorders of bone density and structure, unspecified site: Secondary | ICD-10-CM | POA: Insufficient documentation

## 2024-08-03 DIAGNOSIS — Z780 Asymptomatic menopausal state: Secondary | ICD-10-CM | POA: Insufficient documentation

## 2024-08-03 NOTE — Progress Notes (Signed)
 Last read by Adrien Fortis Deb at 2:00PM on 08/03/2024.

## 2024-08-03 NOTE — Addendum Note (Signed)
 Addended by: DORINA DALLAS DORINA PA-C M on: 08/03/2024 01:05 PM   Modules accepted: Orders

## 2024-08-09 ENCOUNTER — Other Ambulatory Visit (HOSPITAL_BASED_OUTPATIENT_CLINIC_OR_DEPARTMENT_OTHER): Payer: Self-pay

## 2024-08-09 ENCOUNTER — Other Ambulatory Visit: Payer: Self-pay | Admitting: Medical

## 2024-08-09 MED ORDER — ALENDRONATE SODIUM 70 MG PO TABS
70.0000 mg | ORAL_TABLET | ORAL | 3 refills | Status: AC
  Filled 2024-08-09: qty 12, 84d supply, fill #0

## 2025-02-02 ENCOUNTER — Encounter (INDEPENDENT_AMBULATORY_CARE_PROVIDER_SITE_OTHER): Admitting: Ophthalmology

## 2025-06-13 ENCOUNTER — Ambulatory Visit
